# Patient Record
Sex: Female | Born: 1950
Health system: Southern US, Community
[De-identification: ages and names within clinical notes are randomized; demographics above are authoritative.]

## PROBLEM LIST (undated history)

## (undated) DIAGNOSIS — J449 Chronic obstructive pulmonary disease, unspecified: Secondary | ICD-10-CM

## (undated) DIAGNOSIS — E785 Hyperlipidemia, unspecified: Secondary | ICD-10-CM

## (undated) DIAGNOSIS — F329 Major depressive disorder, single episode, unspecified: Secondary | ICD-10-CM

## (undated) DIAGNOSIS — I10 Essential (primary) hypertension: Secondary | ICD-10-CM

## (undated) DIAGNOSIS — F419 Anxiety disorder, unspecified: Secondary | ICD-10-CM

## (undated) DIAGNOSIS — M1990 Unspecified osteoarthritis, unspecified site: Secondary | ICD-10-CM

## (undated) DIAGNOSIS — Z9889 Other specified postprocedural states: Secondary | ICD-10-CM

## (undated) DIAGNOSIS — K219 Gastro-esophageal reflux disease without esophagitis: Secondary | ICD-10-CM

## (undated) DIAGNOSIS — R928 Other abnormal and inconclusive findings on diagnostic imaging of breast: Secondary | ICD-10-CM

## (undated) DIAGNOSIS — F32A Depression, unspecified: Secondary | ICD-10-CM

## (undated) DIAGNOSIS — M8580 Other specified disorders of bone density and structure, unspecified site: Secondary | ICD-10-CM

## (undated) DIAGNOSIS — R112 Nausea with vomiting, unspecified: Secondary | ICD-10-CM

## (undated) HISTORY — DX: Other specified disorders of bone density and structure, unspecified site: M85.80

## (undated) HISTORY — DX: Hyperlipidemia, unspecified: E78.5

## (undated) HISTORY — DX: Unspecified osteoarthritis, unspecified site: M19.90

## (undated) HISTORY — PX: OTHER SURGICAL HISTORY: SHX169

## (undated) HISTORY — DX: Anxiety disorder, unspecified: F41.9

## (undated) HISTORY — DX: Essential (primary) hypertension: I10

---

## 1982-01-29 HISTORY — PX: TUBAL LIGATION: SHX77

## 1998-01-29 HISTORY — PX: GALLBLADDER SURGERY: SHX652

## 2007-08-04 ENCOUNTER — Ambulatory Visit: Payer: Self-pay | Admitting: Orthopedic Surgery

## 2007-08-04 DIAGNOSIS — M25469 Effusion, unspecified knee: Secondary | ICD-10-CM

## 2007-08-04 DIAGNOSIS — M1710 Unilateral primary osteoarthritis, unspecified knee: Secondary | ICD-10-CM

## 2007-08-04 DIAGNOSIS — M25569 Pain in unspecified knee: Secondary | ICD-10-CM

## 2007-08-20 ENCOUNTER — Ambulatory Visit: Payer: Self-pay | Admitting: Orthopedic Surgery

## 2007-08-20 DIAGNOSIS — M23302 Other meniscus derangements, unspecified lateral meniscus, unspecified knee: Secondary | ICD-10-CM | POA: Insufficient documentation

## 2007-08-28 ENCOUNTER — Telehealth: Payer: Self-pay | Admitting: Orthopedic Surgery

## 2007-08-28 ENCOUNTER — Ambulatory Visit (HOSPITAL_COMMUNITY): Admission: RE | Admit: 2007-08-28 | Discharge: 2007-08-28 | Payer: Self-pay | Admitting: Orthopedic Surgery

## 2007-09-08 ENCOUNTER — Ambulatory Visit: Payer: Self-pay | Admitting: Orthopedic Surgery

## 2007-10-08 ENCOUNTER — Ambulatory Visit: Payer: Self-pay | Admitting: Orthopedic Surgery

## 2007-10-24 ENCOUNTER — Encounter: Payer: Self-pay | Admitting: Orthopedic Surgery

## 2008-05-04 ENCOUNTER — Encounter: Payer: Self-pay | Admitting: Orthopedic Surgery

## 2008-05-25 ENCOUNTER — Ambulatory Visit: Payer: Self-pay | Admitting: Orthopedic Surgery

## 2008-05-25 DIAGNOSIS — M7580 Other shoulder lesions, unspecified shoulder: Secondary | ICD-10-CM

## 2008-05-25 DIAGNOSIS — M25519 Pain in unspecified shoulder: Secondary | ICD-10-CM

## 2008-07-07 ENCOUNTER — Ambulatory Visit: Payer: Self-pay | Admitting: Orthopedic Surgery

## 2015-08-30 ENCOUNTER — Encounter: Payer: Self-pay | Admitting: *Deleted

## 2015-08-31 ENCOUNTER — Ambulatory Visit (INDEPENDENT_AMBULATORY_CARE_PROVIDER_SITE_OTHER): Payer: Medicare HMO | Admitting: Cardiology

## 2015-08-31 ENCOUNTER — Encounter: Payer: Self-pay | Admitting: Cardiology

## 2015-08-31 ENCOUNTER — Encounter: Payer: Self-pay | Admitting: *Deleted

## 2015-08-31 VITALS — BP 147/72 | HR 55 | Ht 63.0 in | Wt 213.8 lb

## 2015-08-31 DIAGNOSIS — I493 Ventricular premature depolarization: Secondary | ICD-10-CM

## 2015-08-31 DIAGNOSIS — I10 Essential (primary) hypertension: Secondary | ICD-10-CM

## 2015-08-31 DIAGNOSIS — R079 Chest pain, unspecified: Secondary | ICD-10-CM

## 2015-08-31 NOTE — Progress Notes (Signed)
Clinical Summary Kristin Peters is a 65 y.o.female seen today as a new patient. She is referred by Dr Woody Seller.   1. Chest pain:  - admission to [facility] 07/2015 with chest pain. Negative workup for ACS.  - 07/2015 echo [facility]: LVEF 55-60%, mild LVH, abnormal diastolic function,   - started about 1 year ago. Dull pain under left breast, 2-3/10. Can occur at any time. Can have some dizziness. No SOB. Not positional. Lasts a few minutes. Occurs 1-2 times a month. No relation to food - has noticed some DOE with housework.   CAD risk factors: HTN, hyperlipidemia, father with "heart troubles", son with history of bicuspid   2. HTN - lisionpril caused cough, changed to losartan - compliant with meds  3. PVCs - noted on previous EKGs.  - she denies any specific palpitations   Past Medical History:  Diagnosis Date  . Anxiety   . Arthritis   . Dyslipidemia   . Hypertension   . Osteopenia      Allergies  Allergen Reactions  . Codeine     REACTION: itch     Current Outpatient Prescriptions  Medication Sig Dispense Refill  . budesonide-formoterol (SYMBICORT) 160-4.5 MCG/ACT inhaler Inhale 2 puffs into the lungs 2 (two) times daily.    . citalopram (CELEXA) 20 MG tablet Take 20 mg by mouth daily.    Marland Kitchen lisinopril (PRINIVIL,ZESTRIL) 20 MG tablet Take 20 mg by mouth daily.    . metoprolol (LOPRESSOR) 50 MG tablet Take 50 mg by mouth 2 (two) times daily.    Marland Kitchen omeprazole (PRILOSEC) 20 MG capsule Take 20 mg by mouth daily.    . simvastatin (ZOCOR) 20 MG tablet Take 20 mg by mouth daily.     No current facility-administered medications for this visit.      No past surgical history on file.   Allergies  Allergen Reactions  . Codeine     REACTION: itch      Family History  Problem Relation Age of Onset  . COPD Mother      Social History Kristin Peters reports that she has never smoked. She has never used smokeless tobacco. Kristin Peters has no alcohol history  on file.   Review of Systems CONSTITUTIONAL: No weight loss, fever, chills, weakness or fatigue.  HEENT: Eyes: No visual loss, blurred vision, double vision or yellow sclerae.No hearing loss, sneezing, congestion, runny nose or sore throat.  SKIN: No rash or itching.  CARDIOVASCULAR: per HPI RESPIRATORY: per HPI GASTROINTESTINAL: No anorexia, nausea, vomiting or diarrhea. No abdominal pain or blood.  GENITOURINARY: No burning on urination, no polyuria NEUROLOGICAL: No headache, dizziness, syncope, paralysis, ataxia, numbness or tingling in the extremities. No change in bowel or bladder control.  MUSCULOSKELETAL: No muscle, back pain, joint pain or stiffness.  LYMPHATICS: No enlarged nodes. No history of splenectomy.  PSYCHIATRIC: No history of depression or anxiety.  ENDOCRINOLOGIC: No reports of sweating, cold or heat intolerance. No polyuria or polydipsia.  Marland Kitchen   Physical Examination Vitals:   08/31/15 0840  BP: (!) 147/72  Pulse: (!) 55   Vitals:   08/31/15 0840  Weight: 213 lb 12.8 oz (97 kg)  Height: 5\' 3"  (1.6 m)    Gen: resting comfortably, no acute distress HEENT: no scleral icterus, pupils equal round and reactive, no palptable cervical adenopathy,  CV: RRR, no m/r/g, no jvd Resp: Clear to auscultation bilaterally GI: abdomen is soft, non-tender, non-distended, normal bowel sounds, no hepatosplenomegaly  MSK: extremities are warm, no edema.  Skin: warm, no rash Neuro:  no focal deficits Psych: appropriate affect     Assessment and Plan  1. Chest pain - we will obtain a 2 day protocol lexiscan, she reports she is not able to run on treadmill  2. PVCs - frequent PVCs noted on EKG, we will obtain 24 hr holter to quantify and evaluate for any sustained arrhythmias. Stress test above to evaluate for ischemic heart disease  3. HTN - mildly elevated in clinic, monitor at this time, if elevated at f/u will titrate up meds.    F/u 3-4 weeks   Arnoldo Lenis,  M.D.

## 2015-08-31 NOTE — Patient Instructions (Signed)
Your physician recommends that you schedule a follow-up appointment in: 3-4 WEEKS WITH DR. Carlock  Your physician recommends that you continue on your current medications as directed. Please refer to the Current Medication list given to you today.  Your physician has requested that you have a lexiscan myoview. For further information please visit [email or web address]. Please follow instruction sheet, as given.  Your physician has recommended that you wear a holter monitor FOR 24 HOURS. Holter monitors are medical devices that record the heart's electrical activity. Doctors most often use these monitors to diagnose arrhythmias. Arrhythmias are problems with the speed or rhythm of the heartbeat. The monitor is a small, portable device. You can wear one while you do your normal daily activities. This is usually used to diagnose what is causing palpitations/syncope (passing out).  Thank you for choosing [facility]!!

## 2015-09-06 ENCOUNTER — Encounter (HOSPITAL_COMMUNITY): Payer: Self-pay

## 2015-09-06 ENCOUNTER — Encounter (HOSPITAL_COMMUNITY)
Admission: RE | Admit: 2015-09-06 | Discharge: 2015-09-06 | Disposition: A | Discharge status: Home / Self Care | Payer: Medicare HMO | Source: Ambulatory Visit | Attending: Cardiology | Admitting: Cardiology

## 2015-09-06 ENCOUNTER — Inpatient Hospital Stay (HOSPITAL_COMMUNITY): Admission: RE | Admit: 2015-09-06 | Payer: Medicare HMO | Source: Ambulatory Visit

## 2015-09-06 DIAGNOSIS — R079 Chest pain, unspecified: Secondary | ICD-10-CM

## 2015-09-06 MED ORDER — SODIUM CHLORIDE 0.9% FLUSH
INTRAVENOUS | Status: AC
  Filled 2015-09-06: qty 120

## 2015-09-06 MED ORDER — SODIUM CHLORIDE 0.9% FLUSH
INTRAVENOUS | Status: AC
  Administered 2015-09-06: 10 mL via INTRAVENOUS
  Filled 2015-09-06: qty 10

## 2015-09-06 MED ORDER — REGADENOSON 0.4 MG/5ML IV SOLN
INTRAVENOUS | Status: AC
  Administered 2015-09-06: 0.4 mg via INTRAVENOUS
  Filled 2015-09-06: qty 5

## 2015-09-06 MED ORDER — TECHNETIUM TC 99M TETROFOSMIN IV KIT
30.0000 | PACK | Freq: Once | INTRAVENOUS | Status: AC | PRN
  Administered 2015-09-06: 30 via INTRAVENOUS

## 2015-09-07 ENCOUNTER — Encounter (HOSPITAL_COMMUNITY)
Admission: RE | Admit: 2015-09-07 | Discharge: 2015-09-07 | Disposition: A | Discharge status: Home / Self Care | Payer: Medicare HMO | Source: Ambulatory Visit | Attending: Cardiology | Admitting: Cardiology

## 2015-09-07 ENCOUNTER — Ambulatory Visit (INDEPENDENT_AMBULATORY_CARE_PROVIDER_SITE_OTHER): Payer: Medicare HMO

## 2015-09-07 ENCOUNTER — Other Ambulatory Visit: Payer: Self-pay | Admitting: *Deleted

## 2015-09-07 DIAGNOSIS — R079 Chest pain, unspecified: Secondary | ICD-10-CM

## 2015-09-07 LAB — NM MYOCAR MULTI W/SPECT W/WALL MOTION / EF
CHL CUP NUCLEAR SSS: 5
LV sys vol: 24 mL
LVDIAVOL: 65 mL (ref 46–106)
NUC STRESS TID: 1.12
Peak HR: 91 {beats}/min
RATE: 0.32
Rest HR: 53 {beats}/min
SDS: 2
SRS: 3

## 2015-09-07 MED ORDER — TECHNETIUM TC 99M TETROFOSMIN IV KIT
25.0000 | PACK | Freq: Once | INTRAVENOUS | Status: AC | PRN
  Administered 2015-09-07: 26 via INTRAVENOUS

## 2015-09-08 ENCOUNTER — Telehealth: Payer: Self-pay | Admitting: *Deleted

## 2015-09-08 NOTE — Telephone Encounter (Signed)
-----   Message from Arnoldo Lenis, MD sent at 09/08/2015  1:48 PM EDT ----- Heart monitor shows some occasional PVCs, overall the number is in the mild to moderate range. Will discuss further at our f/u, overall just something to monitor at this time  Zandra Abts MD

## 2015-09-08 NOTE — Telephone Encounter (Signed)
-----   Message from Arnoldo Lenis, MD sent at 09/08/2015  1:48 PM EDT ----- Stress test overall looks good, no significant evidence of blockages. Will discuss at our f/u  Zandra Abts MD

## 2015-09-08 NOTE — Telephone Encounter (Signed)
Pt aware, routed to pcp. Confirmed 9/7 appt

## 2015-10-06 ENCOUNTER — Encounter: Payer: Self-pay | Admitting: Cardiology

## 2015-10-06 ENCOUNTER — Ambulatory Visit (INDEPENDENT_AMBULATORY_CARE_PROVIDER_SITE_OTHER): Payer: Medicare HMO | Admitting: Cardiology

## 2015-10-06 VITALS — BP 130/80 | HR 62 | Ht 63.0 in | Wt 220.4 lb

## 2015-10-06 DIAGNOSIS — I493 Ventricular premature depolarization: Secondary | ICD-10-CM

## 2015-10-06 DIAGNOSIS — R079 Chest pain, unspecified: Secondary | ICD-10-CM

## 2015-10-06 DIAGNOSIS — I10 Essential (primary) hypertension: Secondary | ICD-10-CM | POA: Diagnosis not present

## 2015-10-06 MED ORDER — METOPROLOL TARTRATE 25 MG PO TABS: 25.0000 mg | ORAL_TABLET | Freq: Two times a day (BID) | ORAL | 3 refills | Status: DC

## 2015-10-06 NOTE — Progress Notes (Signed)
Clinical Summary Ms. [place] is a 65 y.o.female seen today for follow up of the following medical problems.    1. Chest pain:  - admission to [facility] 07/2015 with chest pain. Negative workup for ACS.  - 07/2015 echo [facility]: LVEF 55-60%, mild LVH, abnormal diastolic function,   - symptoms started about 1 year ago. Dull pain under left breast, 2-3/10. Can occur at any time. Can have some dizziness. No SOB. Not positional. Lasts a few minutes. Occurs 1-2 times a month. No relation to food - has noticed some DOE with housework.  CAD risk factors: HTN, hyperlipidemia, father with "heart troubles", son with history of bicuspid  - recent lexiscan overall low risk, no significant ischemia.   2. HTN - lisionpril caused cough, changed to losartan - compliant with meds since last visit.   3. PVCs - noted on previous EKGs.  - she denies any specific palpitations - holter with 9% PVCs - no evidence of structural heart disease by echo or nuclear stress test.     Past Medical History:  Diagnosis Date  . Anxiety   . Arthritis   . Dyslipidemia   . Hypertension   . Osteopenia      Allergies  Allergen Reactions  . Codeine     REACTION: itch  . Lisinopril Cough     Current Outpatient Prescriptions  Medication Sig Dispense Refill  . ALPRAZolam (XANAX) 0.5 MG tablet Take 0.5 mg by mouth 2 (two) times daily.    Marland Kitchen aspirin 81 MG tablet Take 81 mg by mouth daily.    . budesonide-formoterol (SYMBICORT) 160-4.5 MCG/ACT inhaler Inhale 2 puffs into the lungs as needed.     . cetirizine (ZYRTEC) 10 MG tablet Take 10 mg by mouth as needed for allergies.    . citalopram (CELEXA) 20 MG tablet Take 20 mg by mouth daily.    Marland Kitchen losartan (COZAAR) 100 MG tablet Take 100 mg by mouth daily.    . metaxalone (SKELAXIN) 800 MG tablet Take 800 mg by mouth as needed for muscle spasms.    . metoprolol (LOPRESSOR) 50 MG tablet Take 50 mg by mouth 2 (two) times daily.    Marland Kitchen  omeprazole (PRILOSEC) 20 MG capsule Take 20 mg by mouth 2 (two) times daily.     . simvastatin (ZOCOR) 20 MG tablet Take 20 mg by mouth daily.     No current facility-administered medications for this visit.      Past Surgical History:  Procedure Laterality Date  . GALLBLADDER SURGERY  2000  . left toe surgery x 3     hammer toe 01-14-08 & 01-06-10, remove screw 09-08-12, broke both sides of that ankle 08-14-14  . TUBAL LIGATION  1984     Allergies  Allergen Reactions  . Codeine     REACTION: itch  . Lisinopril Cough      Family History  Problem Relation Age of Onset  . COPD Mother      Social History Ms. [place] reports that she has never smoked. She has never used smokeless tobacco. Ms. [place] has no alcohol history on file.   Review of Systems CONSTITUTIONAL: No weight loss, fever, chills, weakness or fatigue.  HEENT: Eyes: No visual loss, blurred vision, double vision or yellow sclerae.No hearing loss, sneezing, congestion, runny nose or sore throat.  SKIN: No rash or itching.  CARDIOVASCULAR: per HPI RESPIRATORY: No shortness of breath, cough or sputum.  GASTROINTESTINAL: No anorexia, nausea, vomiting or  diarrhea. No abdominal pain or blood.  GENITOURINARY: No burning on urination, no polyuria NEUROLOGICAL: No headache, dizziness, syncope, paralysis, ataxia, numbness or tingling in the extremities. No change in bowel or bladder control.  MUSCULOSKELETAL: No muscle, back pain, joint pain or stiffness.  LYMPHATICS: No enlarged nodes. No history of splenectomy.  PSYCHIATRIC: No history of depression or anxiety.  ENDOCRINOLOGIC: No reports of sweating, cold or heat intolerance. No polyuria or polydipsia.  Marland Kitchen   Physical Examination Vitals:   10/06/15 1140  BP: 130/80  Pulse: 62   Vitals:   10/06/15 1140  Weight: 220 lb 6.4 oz (100 kg)  Height: 5\' 3"  (1.6 m)    Gen: resting comfortably, no acute distress HEENT: no scleral icterus, pupils equal round and  reactive, no palptable cervical adenopathy,  CV: RRR, no m/r/g, no jvd Resp: Clear to auscultation bilaterally GI: abdomen is soft, non-tender, non-distended, normal bowel sounds, no hepatosplenomegaly MSK: extremities are warm, no edema.  Skin: warm, no rash Neuro:  no focal deficits Psych: appropriate affect   Diagnostic Studies 08/2015 Holter  Rhythm throughout study is sinus rhythm  Min HR 46, max HR 92, Avg HR 62. The low heart rate of 46 occurred at 3AM presumably while patient sleeping  There is rare supraventicular ectopy  There is occasional ventricular ectopy (7393 beats, 9%) in the form of isolated PVCs and bigeminy. No nonsustained or sustained ventricuarl arrhythmias  No diary submitted   08/2015 Nuclear stress  ST segment depression was noted during stress in the II, III, aVF, V5 and V6 leads. Nonspecific TW change in V3-V4.  The study is normal. Small mild basal septal defect likely related to adjacent gut radiotracer uptake, small area of ischemia less likely. Either findings supports low risk.  The left ventricular ejection fraction is normal (55-65%).  This is a low risk study.      Assessment and Plan  1. Chest pain - symptoms improved since last visit. Recent lexiscan overall low risk - continue to monitor at this time - EKG shows NSR, no ischemic changes  2. PVCs - mild to moderate PVC burden by recent monitor. Asymptomatic. No significant structural heart disase by nuclear stress or echo - change lopressor to 25mg  bid  3. HTN - at goal, continue current meds   F/u 6 months      Arnoldo Lenis, M.D.

## 2015-10-06 NOTE — Patient Instructions (Signed)
Your physician wants you to follow-up in: [place] DR. BRANCH  You will receive a reminder letter in the mail two months in advance. If you don't receive a letter, please call our office to schedule the follow-up appointment.  Your physician has recommended you make the following change in your medication:   DECREASE METOPROLOL 25 MG TWICE DAILY  Thank you for choosing [facility]!!

## 2016-02-02 DIAGNOSIS — R69 Illness, unspecified: Secondary | ICD-10-CM | POA: Diagnosis not present

## 2016-02-02 DIAGNOSIS — Z789 Other specified health status: Secondary | ICD-10-CM | POA: Diagnosis not present

## 2016-02-02 DIAGNOSIS — B356 Tinea cruris: Secondary | ICD-10-CM | POA: Diagnosis not present

## 2016-02-02 DIAGNOSIS — Z299 Encounter for prophylactic measures, unspecified: Secondary | ICD-10-CM | POA: Diagnosis not present

## 2016-02-02 DIAGNOSIS — Z6838 Body mass index (BMI) 38.0-38.9, adult: Secondary | ICD-10-CM | POA: Diagnosis not present

## 2016-02-02 DIAGNOSIS — I10 Essential (primary) hypertension: Secondary | ICD-10-CM | POA: Diagnosis not present

## 2016-04-17 ENCOUNTER — Ambulatory Visit: Payer: Medicare HMO | Admitting: Cardiology

## 2016-04-26 DIAGNOSIS — M79672 Pain in left foot: Secondary | ICD-10-CM | POA: Diagnosis not present

## 2016-04-26 DIAGNOSIS — M79671 Pain in right foot: Secondary | ICD-10-CM | POA: Diagnosis not present

## 2016-04-26 DIAGNOSIS — T8484XA Pain due to internal orthopedic prosthetic devices, implants and grafts, initial encounter: Secondary | ICD-10-CM | POA: Diagnosis not present

## 2016-04-26 DIAGNOSIS — M722 Plantar fascial fibromatosis: Secondary | ICD-10-CM | POA: Diagnosis not present

## 2016-04-30 ENCOUNTER — Encounter: Payer: Self-pay | Admitting: Cardiology

## 2016-04-30 ENCOUNTER — Ambulatory Visit (INDEPENDENT_AMBULATORY_CARE_PROVIDER_SITE_OTHER): Payer: Medicare HMO | Admitting: Cardiology

## 2016-04-30 VITALS — BP 112/75 | HR 62 | Ht 63.0 in | Wt 224.6 lb

## 2016-04-30 DIAGNOSIS — R0789 Other chest pain: Secondary | ICD-10-CM

## 2016-04-30 DIAGNOSIS — I10 Essential (primary) hypertension: Secondary | ICD-10-CM

## 2016-04-30 DIAGNOSIS — I493 Ventricular premature depolarization: Secondary | ICD-10-CM

## 2016-04-30 DIAGNOSIS — M79606 Pain in leg, unspecified: Secondary | ICD-10-CM

## 2016-04-30 NOTE — Patient Instructions (Signed)
Medication Instructions:  Continue all current medications.  Labwork: none  Testing/Procedures:  Your physician has requested that you have an ankle brachial index (ABI). During this test an ultrasound and blood pressure cuff are used to evaluate the arteries that supply the arms and legs with blood. Allow thirty minutes for this exam. There are no restrictions or special instructions.  Office will contact with results via phone or letter.    Follow-Up: Your physician wants you to follow up in: 6 months.  You will receive a reminder letter in the mail one-two months in advance.  If you don't receive a letter, please call our office to schedule the follow up appointment   Any Other Special Instructions Will Be Listed Below (If Applicable).  If you need a refill on your cardiac medications before your next appointment, please call your pharmacy.

## 2016-04-30 NOTE — Progress Notes (Signed)
Clinical Summary Kristin Peters is a 66 y.o.female seen today for follow up of the following medical problems.    1. Chest pain - admission to [facility] 07/2015 with chest pain. Negative workup for ACS.  - 07/2015 echo [facility]: LVEF 55-60%, mild LVH, abnormal diastolic function,   - 0/9811 lexiscan overall low risk, no significant ischemia.  - no recent chest pain   2. HTN - lisionpril caused cough, changed to losartan - she remains compliant with meds  3. PVCs - noted on previous EKGs.  - holter with 9% PVCs - no evidence of structural heart disease by echo or nuclear stress test.   - no recent palpitations since last visit  4. Leg pain -can get right sided leg cramps. Mainly occurs at night.   5. LE edema - left sided, prior fracture in the past on left.    Upcoming labs with pcp  Past Medical History:  Diagnosis Date  . Anxiety   . Arthritis   . Dyslipidemia   . Hypertension   . Osteopenia      Allergies  Allergen Reactions  . Codeine     REACTION: itch  . Lisinopril Cough     Current Outpatient Prescriptions  Medication Sig Dispense Refill  . ALPRAZolam (XANAX) 0.5 MG tablet Take 0.5 mg by mouth 2 (two) times daily.    Marland Kitchen amLODipine (NORVASC) 5 MG tablet Take 5 mg by mouth daily.    Marland Kitchen aspirin 81 MG tablet Take 81 mg by mouth daily.    . budesonide-formoterol (SYMBICORT) 160-4.5 MCG/ACT inhaler Inhale 2 puffs into the lungs as needed.     . cetirizine (ZYRTEC) 10 MG tablet Take 10 mg by mouth as needed for allergies.    . citalopram (CELEXA) 20 MG tablet Take 20 mg by mouth daily.    Marland Kitchen losartan (COZAAR) 100 MG tablet Take 100 mg by mouth daily.    . metaxalone (SKELAXIN) 800 MG tablet Take 800 mg by mouth as needed for muscle spasms.    . metoprolol tartrate (LOPRESSOR) 25 MG tablet Take 1 tablet (25 mg total) by mouth 2 (two) times daily. 180 tablet 3  . omeprazole (PRILOSEC) 20 MG capsule Take 20 mg by mouth 2 (two) times daily.      . simvastatin (ZOCOR) 20 MG tablet Take 20 mg by mouth daily.     No current facility-administered medications for this visit.      Past Surgical History:  Procedure Laterality Date  . GALLBLADDER SURGERY  2000  . left toe surgery x 3     hammer toe 01-14-08 & 01-06-10, remove screw 09-08-12, broke both sides of that ankle 08-14-14  . TUBAL LIGATION  1984     Allergies  Allergen Reactions  . Codeine     REACTION: itch  . Lisinopril Cough      Family History  Problem Relation Age of Onset  . COPD Mother      Social History Kristin Peters reports that she has never smoked. She has never used smokeless tobacco. Kristin Peters has no alcohol history on file.   Review of Systems CONSTITUTIONAL: No weight loss, fever, chills, weakness or fatigue.  HEENT: Eyes: No visual loss, blurred vision, double vision or yellow sclerae.No hearing loss, sneezing, congestion, runny nose or sore throat.  SKIN: No rash or itching.  CARDIOVASCULAR: per hpi RESPIRATORY: No shortness of breath, cough or sputum.  GASTROINTESTINAL: No anorexia, nausea, vomiting or diarrhea. No abdominal  pain or blood.  GENITOURINARY: No burning on urination, no polyuria NEUROLOGICAL: No headache, dizziness, syncope, paralysis, ataxia, numbness or tingling in the extremities. No change in bowel or bladder control.  MUSCULOSKELETAL: No muscle, back pain, joint pain or stiffness.  LYMPHATICS: No enlarged nodes. No history of splenectomy.  PSYCHIATRIC: No history of depression or anxiety.  ENDOCRINOLOGIC: No reports of sweating, cold or heat intolerance. No polyuria or polydipsia.  Marland Kitchen   Physical Examination Vitals:   04/30/16 1527  BP: 112/75  Pulse: 62   Vitals:   04/30/16 1527  Weight: 224 lb 9.6 oz (101.9 kg)  Height: 5\' 3"  (1.6 m)    Gen: resting comfortably, no acute distress HEENT: no scleral icterus, pupils equal round and reactive, no palptable cervical adenopathy,  CV: RRR, no m/r/g, no jvd.  Decreased bilateral PT/DP pulsesl Resp: Clear to auscultation bilaterally GI: abdomen is soft, non-tender, non-distended, normal bowel sounds, no hepatosplenomegaly MSK: extremities are warm, no edema.  Skin: warm, no rash Neuro:  no focal deficits Psych: appropriate affect   Diagnostic Studies 08/2015 Holter  Rhythm throughout study is sinus rhythm  Min HR 46, max HR 92, Avg HR 62. The low heart rate of 46 occurred at 3AM presumably while patient sleeping  There is rare supraventicular ectopy  There is occasional ventricular ectopy (7393 beats, 9%) in the form of isolated PVCs and bigeminy. No nonsustained or sustained ventricuarl arrhythmias  No diary submitted   08/2015 Nuclear stress  ST segment depression was noted during stress in the II, III, aVF, V5 and V6 leads. Nonspecific TW change in V3-V4.  The study is normal. Small mild basal septal defect likely related to adjacent gut radiotracer uptake, small area of ischemia less likely. Either findings supports low risk.  The left ventricular ejection fraction is normal (55-65%).  This is a low risk study.     Assessment and Plan  1. Chest pain - Recent lexiscan overall low risk - no recent symptoms - continue to monitor.   2. PVCs - mild to moderate PVC burden by recent monitor. Asymptomatic. No significant structural heart disase by nuclear stress or echo - continue beta blocker  3. HTN - bp is at goal, continue current meds  4. Leg pains - atypical for claudication, she does have dimished pulses bilaterally obtian abi's   F/u 6 months      Arnoldo Lenis, M.D.

## 2016-05-02 ENCOUNTER — Other Ambulatory Visit: Payer: Self-pay | Admitting: Cardiology

## 2016-05-02 DIAGNOSIS — Z0000 Encounter for general adult medical examination without abnormal findings: Secondary | ICD-10-CM | POA: Diagnosis not present

## 2016-05-02 DIAGNOSIS — Z713 Dietary counseling and surveillance: Secondary | ICD-10-CM | POA: Diagnosis not present

## 2016-05-02 DIAGNOSIS — E7800 Pure hypercholesterolemia, unspecified: Secondary | ICD-10-CM | POA: Diagnosis not present

## 2016-05-02 DIAGNOSIS — I10 Essential (primary) hypertension: Secondary | ICD-10-CM | POA: Diagnosis not present

## 2016-05-02 DIAGNOSIS — Z1211 Encounter for screening for malignant neoplasm of colon: Secondary | ICD-10-CM | POA: Diagnosis not present

## 2016-05-02 DIAGNOSIS — I739 Peripheral vascular disease, unspecified: Secondary | ICD-10-CM

## 2016-05-02 DIAGNOSIS — R69 Illness, unspecified: Secondary | ICD-10-CM | POA: Diagnosis not present

## 2016-05-02 DIAGNOSIS — Z79899 Other long term (current) drug therapy: Secondary | ICD-10-CM | POA: Diagnosis not present

## 2016-05-02 DIAGNOSIS — Z7189 Other specified counseling: Secondary | ICD-10-CM | POA: Diagnosis not present

## 2016-05-02 DIAGNOSIS — Z1389 Encounter for screening for other disorder: Secondary | ICD-10-CM | POA: Diagnosis not present

## 2016-05-02 DIAGNOSIS — Z299 Encounter for prophylactic measures, unspecified: Secondary | ICD-10-CM | POA: Diagnosis not present

## 2016-05-02 DIAGNOSIS — K589 Irritable bowel syndrome without diarrhea: Secondary | ICD-10-CM | POA: Diagnosis not present

## 2016-05-09 ENCOUNTER — Ambulatory Visit: Payer: Medicare HMO

## 2016-05-09 DIAGNOSIS — I739 Peripheral vascular disease, unspecified: Secondary | ICD-10-CM

## 2016-05-09 DIAGNOSIS — R0989 Other specified symptoms and signs involving the circulatory and respiratory systems: Secondary | ICD-10-CM | POA: Diagnosis not present

## 2016-05-09 DIAGNOSIS — R202 Paresthesia of skin: Secondary | ICD-10-CM | POA: Diagnosis not present

## 2016-05-10 DIAGNOSIS — D0471 Carcinoma in situ of skin of right lower limb, including hip: Secondary | ICD-10-CM | POA: Diagnosis not present

## 2016-05-10 DIAGNOSIS — Z299 Encounter for prophylactic measures, unspecified: Secondary | ICD-10-CM | POA: Diagnosis not present

## 2016-05-10 DIAGNOSIS — Z6839 Body mass index (BMI) 39.0-39.9, adult: Secondary | ICD-10-CM | POA: Diagnosis not present

## 2016-05-10 DIAGNOSIS — L820 Inflamed seborrheic keratosis: Secondary | ICD-10-CM | POA: Diagnosis not present

## 2016-05-14 DIAGNOSIS — M1711 Unilateral primary osteoarthritis, right knee: Secondary | ICD-10-CM | POA: Diagnosis not present

## 2016-05-21 ENCOUNTER — Telehealth: Payer: Self-pay | Admitting: *Deleted

## 2016-05-21 NOTE — Telephone Encounter (Signed)
-----   Message from Arnoldo Lenis, MD sent at 05/21/2016 10:31 AM EDT ----- Leg tests show normal circulation, her leg pains are not related to any issues with circulation, needs to discuss other causes with pcp  J BrancH MD

## 2016-05-21 NOTE — Telephone Encounter (Signed)
Pt aware - routed to pcp  

## 2016-05-28 DIAGNOSIS — M1711 Unilateral primary osteoarthritis, right knee: Secondary | ICD-10-CM | POA: Diagnosis not present

## 2016-06-04 DIAGNOSIS — M1711 Unilateral primary osteoarthritis, right knee: Secondary | ICD-10-CM | POA: Diagnosis not present

## 2016-06-11 DIAGNOSIS — M1711 Unilateral primary osteoarthritis, right knee: Secondary | ICD-10-CM | POA: Diagnosis not present

## 2016-06-27 DIAGNOSIS — C4492 Squamous cell carcinoma of skin, unspecified: Secondary | ICD-10-CM | POA: Diagnosis not present

## 2016-06-27 DIAGNOSIS — B079 Viral wart, unspecified: Secondary | ICD-10-CM | POA: Diagnosis not present

## 2016-07-30 DIAGNOSIS — Z6838 Body mass index (BMI) 38.0-38.9, adult: Secondary | ICD-10-CM | POA: Diagnosis not present

## 2016-07-30 DIAGNOSIS — R69 Illness, unspecified: Secondary | ICD-10-CM | POA: Diagnosis not present

## 2016-07-30 DIAGNOSIS — R600 Localized edema: Secondary | ICD-10-CM | POA: Diagnosis not present

## 2016-07-30 DIAGNOSIS — E785 Hyperlipidemia, unspecified: Secondary | ICD-10-CM | POA: Diagnosis not present

## 2016-07-30 DIAGNOSIS — I10 Essential (primary) hypertension: Secondary | ICD-10-CM | POA: Diagnosis not present

## 2016-07-30 DIAGNOSIS — K589 Irritable bowel syndrome without diarrhea: Secondary | ICD-10-CM | POA: Diagnosis not present

## 2016-07-30 DIAGNOSIS — Z299 Encounter for prophylactic measures, unspecified: Secondary | ICD-10-CM | POA: Diagnosis not present

## 2016-07-30 DIAGNOSIS — Z713 Dietary counseling and surveillance: Secondary | ICD-10-CM | POA: Diagnosis not present

## 2016-07-30 DIAGNOSIS — M1710 Unilateral primary osteoarthritis, unspecified knee: Secondary | ICD-10-CM | POA: Diagnosis not present

## 2016-10-30 DIAGNOSIS — Z299 Encounter for prophylactic measures, unspecified: Secondary | ICD-10-CM | POA: Diagnosis not present

## 2016-10-30 DIAGNOSIS — C4492 Squamous cell carcinoma of skin, unspecified: Secondary | ICD-10-CM | POA: Diagnosis not present

## 2016-10-30 DIAGNOSIS — K589 Irritable bowel syndrome without diarrhea: Secondary | ICD-10-CM | POA: Diagnosis not present

## 2016-10-30 DIAGNOSIS — Z23 Encounter for immunization: Secondary | ICD-10-CM | POA: Diagnosis not present

## 2016-10-30 DIAGNOSIS — E785 Hyperlipidemia, unspecified: Secondary | ICD-10-CM | POA: Diagnosis not present

## 2016-10-30 DIAGNOSIS — R69 Illness, unspecified: Secondary | ICD-10-CM | POA: Diagnosis not present

## 2016-10-30 DIAGNOSIS — M1710 Unilateral primary osteoarthritis, unspecified knee: Secondary | ICD-10-CM | POA: Diagnosis not present

## 2016-10-30 DIAGNOSIS — L259 Unspecified contact dermatitis, unspecified cause: Secondary | ICD-10-CM | POA: Diagnosis not present

## 2016-10-30 DIAGNOSIS — I10 Essential (primary) hypertension: Secondary | ICD-10-CM | POA: Diagnosis not present

## 2016-10-30 DIAGNOSIS — Z6839 Body mass index (BMI) 39.0-39.9, adult: Secondary | ICD-10-CM | POA: Diagnosis not present

## 2017-01-29 HISTORY — PX: BREAST EXCISIONAL BIOPSY: SUR124

## 2017-01-29 HISTORY — PX: BREAST BIOPSY: SHX20

## 2017-01-30 DIAGNOSIS — I10 Essential (primary) hypertension: Secondary | ICD-10-CM | POA: Diagnosis not present

## 2017-01-30 DIAGNOSIS — Z6839 Body mass index (BMI) 39.0-39.9, adult: Secondary | ICD-10-CM | POA: Diagnosis not present

## 2017-01-30 DIAGNOSIS — R69 Illness, unspecified: Secondary | ICD-10-CM | POA: Diagnosis not present

## 2017-01-30 DIAGNOSIS — Z299 Encounter for prophylactic measures, unspecified: Secondary | ICD-10-CM | POA: Diagnosis not present

## 2017-01-30 DIAGNOSIS — E785 Hyperlipidemia, unspecified: Secondary | ICD-10-CM | POA: Diagnosis not present

## 2017-05-07 DIAGNOSIS — E7800 Pure hypercholesterolemia, unspecified: Secondary | ICD-10-CM | POA: Diagnosis not present

## 2017-05-07 DIAGNOSIS — I10 Essential (primary) hypertension: Secondary | ICD-10-CM | POA: Diagnosis not present

## 2017-05-07 DIAGNOSIS — M159 Polyosteoarthritis, unspecified: Secondary | ICD-10-CM | POA: Diagnosis not present

## 2017-05-08 DIAGNOSIS — B356 Tinea cruris: Secondary | ICD-10-CM | POA: Diagnosis not present

## 2017-05-08 DIAGNOSIS — E785 Hyperlipidemia, unspecified: Secondary | ICD-10-CM | POA: Diagnosis not present

## 2017-05-08 DIAGNOSIS — Z0000 Encounter for general adult medical examination without abnormal findings: Secondary | ICD-10-CM | POA: Diagnosis not present

## 2017-05-08 DIAGNOSIS — R5383 Other fatigue: Secondary | ICD-10-CM | POA: Diagnosis not present

## 2017-05-08 DIAGNOSIS — Z1211 Encounter for screening for malignant neoplasm of colon: Secondary | ICD-10-CM | POA: Diagnosis not present

## 2017-05-08 DIAGNOSIS — J069 Acute upper respiratory infection, unspecified: Secondary | ICD-10-CM | POA: Diagnosis not present

## 2017-05-08 DIAGNOSIS — Z79899 Other long term (current) drug therapy: Secondary | ICD-10-CM | POA: Diagnosis not present

## 2017-05-08 DIAGNOSIS — Z7189 Other specified counseling: Secondary | ICD-10-CM | POA: Diagnosis not present

## 2017-05-08 DIAGNOSIS — Z1339 Encounter for screening examination for other mental health and behavioral disorders: Secondary | ICD-10-CM | POA: Diagnosis not present

## 2017-05-08 DIAGNOSIS — Z1331 Encounter for screening for depression: Secondary | ICD-10-CM | POA: Diagnosis not present

## 2017-05-08 DIAGNOSIS — I10 Essential (primary) hypertension: Secondary | ICD-10-CM | POA: Diagnosis not present

## 2017-05-08 DIAGNOSIS — R69 Illness, unspecified: Secondary | ICD-10-CM | POA: Diagnosis not present

## 2017-05-28 DIAGNOSIS — E2839 Other primary ovarian failure: Secondary | ICD-10-CM | POA: Diagnosis not present

## 2017-07-08 DIAGNOSIS — M1711 Unilateral primary osteoarthritis, right knee: Secondary | ICD-10-CM | POA: Diagnosis not present

## 2017-07-10 ENCOUNTER — Encounter: Payer: Self-pay | Admitting: Cardiology

## 2017-07-10 ENCOUNTER — Ambulatory Visit: Payer: Medicare HMO | Admitting: Cardiology

## 2017-07-10 VITALS — BP 137/81 | HR 58 | Ht 63.0 in | Wt 224.4 lb

## 2017-07-10 DIAGNOSIS — R0789 Other chest pain: Secondary | ICD-10-CM

## 2017-07-10 DIAGNOSIS — I10 Essential (primary) hypertension: Secondary | ICD-10-CM | POA: Diagnosis not present

## 2017-07-10 DIAGNOSIS — I493 Ventricular premature depolarization: Secondary | ICD-10-CM | POA: Diagnosis not present

## 2017-07-10 NOTE — Progress Notes (Signed)
Clinical Summary Kristin Peters is a 67 y.o.female seen today for follow up of the following medical problems.    1. Chest pain - admission to [facility] 07/2015 with chest pain. Negative workup for ACS.  - 07/2015 echo [facility]: LVEF 55-60%, mild LVH, abnormal diastolic function,  - 03/252 lexiscan overall low risk, no significant ischemia.    - mild chest pain at times. Epigastric to under left breast. Drank 7up with some improvement. Lasted about 10 minutes.  - no exertional chest pain.    2. HTN - lisionpril caused cough, changed to losartan - home bps 120s/80s.  - compliant with meds  3. PVCs - noted on previous EKGs.  - holter with 9% PVCs - no evidence of structural heart disease by echo or nuclear stress test.   - no recent palpitations since last visit   4. LE edema - left sided, prior fracture in the past on left.       Past Medical History:  Diagnosis Date  . Anxiety   . Arthritis   . Dyslipidemia   . Hypertension   . Osteopenia      Allergies  Allergen Reactions  . Codeine     REACTION: itch  . Lisinopril Cough     Current Outpatient Medications  Medication Sig Dispense Refill  . ALPRAZolam (XANAX) 0.5 MG tablet Take 0.5 mg by mouth 2 (two) times daily.    Marland Kitchen amLODipine (NORVASC) 5 MG tablet Take 5 mg by mouth daily.    Marland Kitchen aspirin 81 MG tablet Take 81 mg by mouth daily.    . budesonide-formoterol (SYMBICORT) 160-4.5 MCG/ACT inhaler Inhale 2 puffs into the lungs as needed.     . citalopram (CELEXA) 20 MG tablet Take 20 mg by mouth daily.    Marland Kitchen losartan (COZAAR) 100 MG tablet Take 100 mg by mouth daily.    . metaxalone (SKELAXIN) 800 MG tablet Take 800 mg by mouth as needed for muscle spasms.    . metoprolol tartrate (LOPRESSOR) 25 MG tablet Take 1 tablet (25 mg total) by mouth 2 (two) times daily. 180 tablet 3  . omeprazole (PRILOSEC) 20 MG capsule Take 20 mg by mouth 2 (two) times daily.     . simvastatin (ZOCOR) 20 MG  tablet Take 20 mg by mouth daily.     No current facility-administered medications for this visit.         Allergies  Allergen Reactions  . Codeine     REACTION: itch  . Lisinopril Cough      Family History  Problem Relation Age of Onset  . COPD Mother      Social History Kristin Peters reports that she has never smoked. She has never used smokeless tobacco. Kristin Peters has no alcohol history on file.   Review of Systems CONSTITUTIONAL: No weight loss, fever, chills, weakness or fatigue.  HEENT: Eyes: No visual loss, blurred vision, double vision or yellow sclerae.No hearing loss, sneezing, congestion, runny nose or sore throat.  SKIN: No rash or itching.  CARDIOVASCULAR: per hpi RESPIRATORY: No shortness of breath, cough or sputum.  GASTROINTESTINAL: No anorexia, nausea, vomiting or diarrhea. No abdominal pain or blood.  GENITOURINARY: No burning on urination, no polyuria NEUROLOGICAL: No headache, dizziness, syncope, paralysis, ataxia, numbness or tingling in the extremities. No change in bowel or bladder control.  MUSCULOSKELETAL: No muscle, back pain, joint pain or stiffness.  LYMPHATICS: No enlarged nodes. No history of splenectomy.  PSYCHIATRIC: No history  of depression or anxiety.  ENDOCRINOLOGIC: No reports of sweating, cold or heat intolerance. No polyuria or polydipsia.  Marland Kitchen   Physical Examination Vitals:   07/10/17 1533  BP: 137/81  Pulse: (!) 58  SpO2: 96%   Vitals:   07/10/17 1533  Weight: 224 lb 6.4 oz (101.8 kg)  Height: 5\' 3"  (1.6 m)    Gen: resting comfortably, no acute distress HEENT: no scleral icterus, pupils equal round and reactive, no palptable cervical adenopathy,  CV: RRR, no m/r/g, no jvd Resp: Clear to auscultation bilaterally GI: abdomen is soft, non-tender, non-distended, normal bowel sounds, no hepatosplenomegaly MSK: extremities are warm, no edema.  Skin: warm, no rash Neuro:  no focal deficits Psych: appropriate  affect   Diagnostic Studies  08/2015 Holter  Rhythm throughout study is sinus rhythm  Min HR 46, max HR 92, Avg HR 62. The low heart rate of 46 occurred at 3AM presumably while patient sleeping  There is rare supraventicular ectopy  There is occasional ventricular ectopy (7393 beats, 9%) in the form of isolated PVCs and bigeminy. No nonsustained or sustained ventricuarl arrhythmias  No diary submitted   08/2015 Nuclear stress  ST segment depression was noted during stress in the II, III, aVF, V5 and V6 leads. Nonspecific TW change in V3-V4.  The study is normal. Small mild basal septal defect likely related to adjacent gut radiotracer uptake, small area of ischemia less likely. Either findings supports low risk.  The left ventricular ejection fraction is normal (55-65%).  This is a low risk study.  04/2016 ABIs R 1.17 Left 1.16    Assessment and Plan   1. Chest pain - prior lexiscan overall low risk -recent mild atypicla symptoms. EKG today SR, no acute ischemic changes - continue to monitor.   2. PVCs - mild to moderate PVC burden by recent monitor. Asymptomatic. No significant structural heart disase by nuclear stress or echo - continue to monitor at this time.   3. HTN - overall at goal, continue current meds. Given info on DASH diet    F/u 1 year       Arnoldo Lenis, M.D.

## 2017-07-10 NOTE — Patient Instructions (Addendum)
Medication Instructions:  Your physician recommends that you continue on your current medications as directed. Please refer to the Current Medication list given to you today.  Labwork: NONE  Testing/Procedures: NONE  Follow-Up: Your physician wants you to follow-up in: [place] DR. BRANCH.  DASH Eating Plan DASH stands for "Dietary Approaches to Stop Hypertension." The DASH eating plan is a healthy eating plan that has been shown to reduce high blood pressure (hypertension). It may also reduce your risk for type 2 diabetes, heart disease, and stroke. The DASH eating plan may also help with weight loss. What are tips for following this plan? General guidelines  Avoid eating more than 2,300 mg (milligrams) of salt (sodium) a day. If you have hypertension, you may need to reduce your sodium intake to 1,500 mg a day.  Limit alcohol intake to no more than 1 drink a day for nonpregnant women and 2 drinks a day for men. One drink equals 12 oz of beer, 5 oz of wine, or 1 oz of hard liquor.  Work with your health care provider to maintain a healthy body weight or to lose weight. Ask what an ideal weight is for you.  Get at least 30 minutes of exercise that causes your heart to beat faster (aerobic exercise) most days of the week. Activities may include walking, swimming, or biking.  Work with your health care provider or diet and nutrition specialist (dietitian) to adjust your eating plan to your individual calorie needs. Reading food labels  Check food labels for the amount of sodium per serving. Choose foods with less than 5 percent of the Daily Value of sodium. Generally, foods with less than 300 mg of sodium per serving fit into this eating plan.  To find whole grains, look for the word "whole" as the first word in the ingredient list. Shopping  Buy products labeled as "low-sodium" or "no salt added."  Buy fresh foods. Avoid canned foods and premade or frozen  meals. Cooking  Avoid adding salt when cooking. Use salt-free seasonings or herbs instead of table salt or sea salt. Check with your health care provider or pharmacist before using salt substitutes.  Do not fry foods. Cook foods using healthy methods such as baking, boiling, grilling, and broiling instead.  Cook with heart-healthy oils, such as olive, canola, soybean, or sunflower oil. Meal planning   Eat a balanced diet that includes: ? 5 or more servings of fruits and vegetables each day. At each meal, try to fill half of your plate with fruits and vegetables. ? Up to 6-8 servings of whole grains each day. ? Less than 6 oz of lean meat, poultry, or fish each day. A 3-oz serving of meat is about the same size as a deck of cards. One egg equals 1 oz. ? 2 servings of low-fat dairy each day. ? A serving of nuts, seeds, or beans 5 times each week. ? Heart-healthy fats. Healthy fats called Omega-3 fatty acids are found in foods such as flaxseeds and coldwater fish, like sardines, salmon, and mackerel.  Limit how much you eat of the following: ? Canned or prepackaged foods. ? Food that is high in trans fat, such as fried foods. ? Food that is high in saturated fat, such as fatty meat. ? Sweets, desserts, sugary drinks, and other foods with added sugar. ? Full-fat dairy products.  Do not salt foods before eating.  Try to eat at least 2 vegetarian meals each week.  Eat more home-cooked  food and less restaurant, buffet, and fast food.  When eating at a restaurant, ask that your food be prepared with less salt or no salt, if possible. What foods are recommended? The items listed may not be a complete list. Talk with your dietitian about what dietary choices are best for you. Grains Whole-grain or whole-wheat bread. Whole-grain or whole-wheat pasta. Brown rice. [place]. Bulgur. Whole-grain and low-sodium cereals. Pita bread. Low-fat, low-sodium crackers. Whole-wheat flour  tortillas. Vegetables Fresh or frozen vegetables (raw, steamed, roasted, or grilled). Low-sodium or reduced-sodium tomato and vegetable juice. Low-sodium or reduced-sodium tomato sauce and tomato paste. Low-sodium or reduced-sodium canned vegetables. Fruits All fresh, dried, or frozen fruit. Canned fruit in natural juice (without added sugar). Meat and other protein foods Skinless chicken or [place]. Ground chicken or [place]. Pork with fat trimmed off. Fish and seafood. Egg whites. Dried beans, peas, or lentils. Unsalted nuts, nut butters, and seeds. Unsalted canned beans. Lean cuts of beef with fat trimmed off. Low-sodium, lean deli meat. Dairy Low-fat (1%) or fat-free (skim) milk. Fat-free, low-fat, or reduced-fat cheeses. Nonfat, low-sodium ricotta or cottage cheese. Low-fat or nonfat yogurt. Low-fat, low-sodium cheese. Fats and oils Soft margarine without trans fats. Vegetable oil. Low-fat, reduced-fat, or light mayonnaise and salad dressings (reduced-sodium). Canola, safflower, olive, soybean, and sunflower oils. Avocado. Seasoning and other foods Herbs. Spices. Seasoning mixes without salt. Unsalted popcorn and pretzels. Fat-free sweets. What foods are not recommended? The items listed may not be a complete list. Talk with your dietitian about what dietary choices are best for you. Grains Baked goods made with fat, such as croissants, muffins, or some breads. Dry pasta or rice meal packs. Vegetables Creamed or fried vegetables. Vegetables in a cheese sauce. Regular canned vegetables (not low-sodium or reduced-sodium). Regular canned tomato sauce and paste (not low-sodium or reduced-sodium). Regular tomato and vegetable juice (not low-sodium or reduced-sodium). Angie Fava. Olives. Fruits Canned fruit in a light or heavy syrup. Fried fruit. Fruit in cream or butter sauce. Meat and other protein foods Fatty cuts of meat. Ribs. Fried meat. Berniece Salines. Sausage. Bologna and other processed lunch meats.  Salami. Fatback. Hotdogs. Bratwurst. Salted nuts and seeds. Canned beans with added salt. Canned or smoked fish. Whole eggs or egg yolks. Chicken or [place] with skin. Dairy Whole or 2% milk, cream, and half-and-half. Whole or full-fat cream cheese. Whole-fat or sweetened yogurt. Full-fat cheese. Nondairy creamers. Whipped toppings. Processed cheese and cheese spreads. Fats and oils Butter. Stick margarine. Lard. Shortening. Ghee. Bacon fat. Tropical oils, such as coconut, palm kernel, or palm oil. Seasoning and other foods Salted popcorn and pretzels. Onion salt, garlic salt, seasoned salt, table salt, and sea salt. Worcestershire sauce. Tartar sauce. Barbecue sauce. Teriyaki sauce. Soy sauce, including reduced-sodium. Steak sauce. Canned and packaged gravies. Fish sauce. Oyster sauce. Cocktail sauce. Horseradish that you find on the shelf. Ketchup. Mustard. Meat flavorings and tenderizers. Bouillon cubes. Hot sauce and Tabasco sauce. Premade or packaged marinades. Premade or packaged taco seasonings. Relishes. Regular salad dressings. Where to find more information:  National Heart, Lung, and [place]: https://wilson-eaton.com/  American Heart Association: www.heart.org Summary  The DASH eating plan is a healthy eating plan that has been shown to reduce high blood pressure (hypertension). It may also reduce your risk for type 2 diabetes, heart disease, and stroke.  With the DASH eating plan, you should limit salt (sodium) intake to 2,300 mg a day. If you have hypertension, you may need to reduce your sodium intake to 1,500 mg  a day.  When on the DASH eating plan, aim to eat more fresh fruits and vegetables, whole grains, lean proteins, low-fat dairy, and heart-healthy fats.  Work with your health care provider or diet and nutrition specialist (dietitian) to adjust your eating plan to your individual calorie needs. This information is not intended to replace advice given to you by your health  care provider. Make sure you discuss any questions you have with your health care provider. Document Released: 01/04/2011 Document Revised: 01/09/2016 Document Reviewed: 01/09/2016 Elsevier Interactive Patient Education  2018 [place] will receive a reminder letter in the mail two months in advance. If you don't receive a letter, please call our office to schedule the follow-up appointment.  Any Other Special Instructions Will Be Listed Below (If Applicable).  If you need a refill on your cardiac medications before your next appointment, please call your pharmacy.

## 2017-07-17 ENCOUNTER — Encounter: Payer: Self-pay | Admitting: Cardiology

## 2017-07-18 DIAGNOSIS — R928 Other abnormal and inconclusive findings on diagnostic imaging of breast: Secondary | ICD-10-CM | POA: Diagnosis not present

## 2017-07-18 DIAGNOSIS — Z1231 Encounter for screening mammogram for malignant neoplasm of breast: Secondary | ICD-10-CM | POA: Diagnosis not present

## 2017-07-22 DIAGNOSIS — M1711 Unilateral primary osteoarthritis, right knee: Secondary | ICD-10-CM | POA: Diagnosis not present

## 2017-07-29 DIAGNOSIS — M1711 Unilateral primary osteoarthritis, right knee: Secondary | ICD-10-CM | POA: Diagnosis not present

## 2017-07-31 DIAGNOSIS — N6311 Unspecified lump in the right breast, upper outer quadrant: Secondary | ICD-10-CM | POA: Diagnosis not present

## 2017-07-31 DIAGNOSIS — R928 Other abnormal and inconclusive findings on diagnostic imaging of breast: Secondary | ICD-10-CM | POA: Diagnosis not present

## 2017-07-31 DIAGNOSIS — N6310 Unspecified lump in the right breast, unspecified quadrant: Secondary | ICD-10-CM | POA: Diagnosis not present

## 2017-08-05 DIAGNOSIS — R69 Illness, unspecified: Secondary | ICD-10-CM | POA: Diagnosis not present

## 2017-08-05 DIAGNOSIS — M1710 Unilateral primary osteoarthritis, unspecified knee: Secondary | ICD-10-CM | POA: Diagnosis not present

## 2017-08-05 DIAGNOSIS — B029 Zoster without complications: Secondary | ICD-10-CM | POA: Diagnosis not present

## 2017-08-05 DIAGNOSIS — Z6839 Body mass index (BMI) 39.0-39.9, adult: Secondary | ICD-10-CM | POA: Diagnosis not present

## 2017-08-05 DIAGNOSIS — E785 Hyperlipidemia, unspecified: Secondary | ICD-10-CM | POA: Diagnosis not present

## 2017-08-05 DIAGNOSIS — Z299 Encounter for prophylactic measures, unspecified: Secondary | ICD-10-CM | POA: Diagnosis not present

## 2017-08-05 DIAGNOSIS — I10 Essential (primary) hypertension: Secondary | ICD-10-CM | POA: Diagnosis not present

## 2017-08-07 DIAGNOSIS — N6489 Other specified disorders of breast: Secondary | ICD-10-CM | POA: Diagnosis not present

## 2017-08-07 DIAGNOSIS — N6311 Unspecified lump in the right breast, upper outer quadrant: Secondary | ICD-10-CM | POA: Diagnosis not present

## 2017-08-22 ENCOUNTER — Other Ambulatory Visit: Payer: Self-pay

## 2017-08-22 ENCOUNTER — Encounter (HOSPITAL_BASED_OUTPATIENT_CLINIC_OR_DEPARTMENT_OTHER)
Admission: RE | Admit: 2017-08-22 | Discharge: 2017-08-22 | Disposition: A | Discharge status: Home / Self Care | Payer: Medicare HMO | Source: Ambulatory Visit | Attending: General Surgery | Admitting: General Surgery

## 2017-08-22 ENCOUNTER — Other Ambulatory Visit: Payer: Self-pay | Admitting: General Surgery

## 2017-08-22 ENCOUNTER — Encounter (HOSPITAL_BASED_OUTPATIENT_CLINIC_OR_DEPARTMENT_OTHER): Payer: Self-pay | Admitting: *Deleted

## 2017-08-22 DIAGNOSIS — Z6839 Body mass index (BMI) 39.0-39.9, adult: Secondary | ICD-10-CM | POA: Diagnosis not present

## 2017-08-22 DIAGNOSIS — N6489 Other specified disorders of breast: Secondary | ICD-10-CM | POA: Diagnosis not present

## 2017-08-22 DIAGNOSIS — Z01812 Encounter for preprocedural laboratory examination: Secondary | ICD-10-CM | POA: Diagnosis not present

## 2017-08-22 DIAGNOSIS — R928 Other abnormal and inconclusive findings on diagnostic imaging of breast: Secondary | ICD-10-CM

## 2017-08-22 DIAGNOSIS — K219 Gastro-esophageal reflux disease without esophagitis: Secondary | ICD-10-CM | POA: Diagnosis not present

## 2017-08-22 DIAGNOSIS — J449 Chronic obstructive pulmonary disease, unspecified: Secondary | ICD-10-CM | POA: Diagnosis not present

## 2017-08-22 DIAGNOSIS — I10 Essential (primary) hypertension: Secondary | ICD-10-CM | POA: Diagnosis not present

## 2017-08-22 LAB — CBC WITH DIFFERENTIAL/PLATELET
Abs Immature Granulocytes: 0 10*3/uL (ref 0.0–0.1)
Basophils Absolute: 0 10*3/uL (ref 0.0–0.1)
Basophils Relative: 1 %
Eosinophils Absolute: 0.1 10*3/uL (ref 0.0–0.7)
Eosinophils Relative: 2 %
HEMATOCRIT: 38.7 % (ref 36.0–46.0)
HEMOGLOBIN: 12.9 g/dL (ref 12.0–15.0)
IMMATURE GRANULOCYTES: 0 %
LYMPHS ABS: 2.1 10*3/uL (ref 0.7–4.0)
LYMPHS PCT: 35 %
MCH: 30.1 pg (ref 26.0–34.0)
MCHC: 33.3 g/dL (ref 30.0–36.0)
MCV: 90.4 fL (ref 78.0–100.0)
MONOS PCT: 7 %
Monocytes Absolute: 0.4 10*3/uL (ref 0.1–1.0)
NEUTROS ABS: 3.4 10*3/uL (ref 1.7–7.7)
NEUTROS PCT: 55 %
Platelets: 179 10*3/uL (ref 150–400)
RBC: 4.28 MIL/uL (ref 3.87–5.11)
RDW: 13.5 % (ref 11.5–15.5)
WBC: 6.1 10*3/uL (ref 4.0–10.5)

## 2017-08-22 LAB — COMPREHENSIVE METABOLIC PANEL
ALT: 18 U/L (ref 0–44)
AST: 22 U/L (ref 15–41)
Albumin: 3.9 g/dL (ref 3.5–5.0)
Alkaline Phosphatase: 47 U/L (ref 38–126)
Anion gap: 8 (ref 5–15)
BILIRUBIN TOTAL: 0.8 mg/dL (ref 0.3–1.2)
BUN: 8 mg/dL (ref 8–23)
CHLORIDE: 102 mmol/L (ref 98–111)
CO2: 28 mmol/L (ref 22–32)
Calcium: 9.3 mg/dL (ref 8.9–10.3)
Creatinine, Ser: 0.94 mg/dL (ref 0.44–1.00)
GFR calc Af Amer: >60 mL/min (ref >60)
GFR calc non Af Amer: >60 mL/min (ref >60)
GLUCOSE: 96 mg/dL (ref 70–99)
POTASSIUM: 4.6 mmol/L (ref 3.5–5.1)
Sodium: 138 mmol/L (ref 135–145)
TOTAL PROTEIN: 7 g/dL (ref 6.5–8.1)

## 2017-08-22 NOTE — Progress Notes (Signed)
Pt given Ensure and instructed to drink by 0445 day of surgery with teach back method. Bring all medications. Had labs drawn today while she was here.

## 2017-08-25 NOTE — H&P (Signed)
Kristin Peters Location: [facility] Patient #: [id number] DOB: [birth year] Married / Language: English / Race: White Female        History of Present Illness       . This is a 67 year old female from [place]. Her husband is with her throughout the encounter. She is referred by Dr. Nolon Nations for evaluation and surgical management of complex sclerosing lesion right breast. Dr. Jerene Bears is her PCP.      She has no prior history of breast problems. Screening mammograms show a small area of distortion in the right breast at the 10 o'clock position. Image guided biopsy showed complex coursing lesion. Excision was recommended by Dr. Nolon Nations. She's done well since the biopsy      Past history consistent with hypertension, hyperlipidemia, controlled anxiety, mild COPD. GERD controlled. Cholecystectomy. Tubal ligation. BMI 39.  Family history is negative for breast or ovarian cancer. Mother died of COPD and brain aneurysm. Father died of heart disease Social history reveals she is married. Second marriage. 2 stepchildren. 2 children of her own. Denies tobacco. Alcohol intake rare. Ligament [place].       We had a long talk. I told her that the statistics are anywhere from 4-10% risk of early cancer but most likely benign. She definitely wants this area excised. She'll be scheduled for right breast lumpectomy with radioactive seed localization. I have discussed the indications, details, techniques, and numerous risk of the surgery with her and her husband. She is aware of the risk of bleeding, infection, cosmetic deformity, chronic pain, reoperation if cancer. She understands these issues. All questions are answered. She agrees with this plan.   Past Surgical History  Breast Biopsy  Right. Foot Surgery  Left. Gallbladder Surgery - Laparoscopic  Tonsillectomy   Diagnostic Studies History  Colonoscopy  5-10 years ago Mammogram   within last year Pap Smear  >5 years ago  Allergies Codeine Phosphate *ANALGESICS - OPIOID*  PredniSONE *CORTICOSTEROIDS*   Medication History AmLODIPine Besylate (5MG  Tablet, Oral) Active. Metoprolol Tartrate (25MG  Tablet, Oral) Active. ALPRAZolam (0.5MG  Tablet, Oral) Active. Citalopram Hydrobromide (20MG  Tablet, Oral) Active. Losartan Potassium (100MG  Tablet, Oral) Active. Omeprazole (20MG  Capsule DR, Oral) Active. Simvastatin (20MG  Tablet, Oral) Active. Symbicort (80-4.5MCG/ACT Aerosol, Inhalation) Active. Medications Reconciled  Social History  Alcohol use  Occasional alcohol use. Caffeine use  Carbonated beverages, Tea. No drug use  Tobacco use  Never smoker.  Family History  Arthritis  Father, Mother. Cerebrovascular Accident  Mother. Heart Disease  Father, Son. Hypertension  Mother. Kidney Disease  Father. Migraine Headache  Mother. Respiratory Condition  Mother.  Pregnancy / Birth History  Age at menarche  58 years. Age of menopause  51-55 Contraceptive History  Oral contraceptives. Gravida  2 Maternal age  53-20 Para  2  Other Problems  Anxiety Disorder  Arthritis  Back Pain  Chronic Obstructive Lung Disease  Depression  Gastroesophageal Reflux Disease  General anesthesia - complications  High blood pressure  Hypercholesterolemia     Review of Systems  General Present- Fatigue. Not Present- Appetite Loss, Chills, Fever, Night Sweats, Weight Gain and Weight Loss. Skin Present- Rash. Not Present- Change in Wart/Mole, Dryness, Hives, Jaundice, New Lesions, Non-Healing Wounds and Ulcer. HEENT Present- Hearing Loss, Seasonal Allergies and Wears glasses/contact lenses. Not Present- Earache, Hoarseness, Nose Bleed, Oral Ulcers, Ringing in the Ears, Sinus Pain, Sore Throat, Visual Disturbances and Yellow Eyes. Respiratory Present- Chronic Cough, Snoring and Wheezing. Not Present- Bloody sputum and Difficulty  Breathing. Breast Present- Breast Pain. Not Present- Breast Mass, Nipple Discharge and Skin Changes. Cardiovascular Present- Leg Cramps and Swelling of Extremities. Not Present- Chest Pain, Difficulty Breathing Lying Down, Palpitations, Rapid Heart Rate and Shortness of Breath. Gastrointestinal Present- Chronic diarrhea and Indigestion. Not Present- Abdominal Pain, Bloating, Bloody Stool, Change in Bowel Habits, Constipation, Difficulty Swallowing, Excessive gas, Gets full quickly at meals, Hemorrhoids, Nausea, Rectal Pain and Vomiting. Female Genitourinary Present- Urgency. Not Present- Frequency, Nocturia, Painful Urination and Pelvic Pain. Musculoskeletal Present- Back Pain, Joint Pain and Swelling of Extremities. Not Present- Joint Stiffness, Muscle Pain and Muscle Weakness. Neurological Not Present- Decreased Memory, Fainting, Headaches, Numbness, Seizures, Tingling, Tremor, Trouble walking and Weakness. Psychiatric Present- Anxiety and Depression. Not Present- Bipolar, Change in Sleep Pattern, Fearful and Frequent crying. Endocrine Not Present- Cold Intolerance, Excessive Hunger, Hair Changes, Heat Intolerance, Hot flashes and New Diabetes. Hematology Present- Easy Bruising. Not Present- Blood Thinners, Excessive bleeding, Gland problems, HIV and Persistent Infections.  Vitals Weight: 220.13 lb Height: 63in Body Surface Area: 2.01 m Body Mass Index: 38.99 kg/m  Temp.: 98.21F(Oral)  Pulse: 56 (Regular)  BP: 120/80 (Sitting, Left Arm, Standard)    Physical Exam  General Mental Status-Alert. General Appearance-Consistent with stated age. Hydration-Well hydrated. Voice-Normal.  Head and Neck Head-normocephalic, atraumatic with no lesions or palpable masses. Trachea-midline. Thyroid Gland Characteristics - normal size and consistency.  Eye Eyeball - Bilateral-Extraocular movements intact. Sclera/Conjunctiva - Bilateral-No scleral icterus.  Chest and  Lung Exam Chest and lung exam reveals -quiet, even and easy respiratory effort with no use of accessory muscles and on auscultation, normal breath sounds, no adventitious sounds and normal vocal resonance. Inspection Chest Wall - Normal. Back - normal.  Breast Note: Breasts are moderately large. Biopsy site on the right noted. No hematoma. No mass. No mass in either breast. No skin changes otherwise. No axillary adenopathy. No supraclavicular adenopathy.   Cardiovascular Cardiovascular examination reveals -normal heart sounds, regular rate and rhythm with no murmurs and normal pedal pulses bilaterally.  Abdomen Inspection Inspection of the abdomen reveals - No Hernias. Skin - Scar - Note: Trocar scars from laparoscopic cholecystectomy. Palpation/Percussion Palpation and Percussion of the abdomen reveal - Soft, Non Tender, No Rebound tenderness, No Rigidity (guarding) and No hepatosplenomegaly. Auscultation Auscultation of the abdomen reveals - Bowel sounds normal.  Neurologic Neurologic evaluation reveals -alert and oriented x 3 with no impairment of recent or remote memory. Mental Status-Normal.  Musculoskeletal Normal Exam - Left-Upper Extremity Strength Normal and Lower Extremity Strength Normal. Normal Exam - Right-Upper Extremity Strength Normal and Lower Extremity Strength Normal.  Lymphatic Head & Neck  General Head & Neck Lymphatics: Bilateral - Description - Normal. Axillary  General Axillary Region: Bilateral - Description - Normal. Tenderness - Non Tender. Femoral & Inguinal  Generalized Femoral & Inguinal Lymphatics: Bilateral - Description - Normal. Tenderness - Non Tender.    Assessment & Plan  ABNORMALITY OF RIGHT BREAST ON SCREENING MAMMOGRAM (R92.8)        Your recent mammograms and biopsy showed a noncancerous abnormality called complex sclerosing lesion Most likely you do not have cancer However there is a 5-10% chance that you have an  early cancer right now Lumpectomy is recommended to prove whether or not you have a cancer You are in agreement     you will be scheduled for right breast lumpectomy with radioactive seed localization I have discussed the indications, techniques, and risks of this surgery in detail I have requested that my office schedule this  surgery next week, if the schedule will allow  HYPERTENSION, ESSENTIAL (I10) COPD, MILD (J44.9) CHRONIC GERD (K21.9) BMI 39.0-39.9,ADULT (Z68.39)    Edsel Petrin. Dalbert Batman, M.D., [facility] General and Minimally invasive Surgery Breast and Colorectal Surgery Office:   [phone] Pager:   [phone]

## 2017-08-28 ENCOUNTER — Ambulatory Visit
Admission: RE | Admit: 2017-08-28 | Discharge: 2017-08-28 | Disposition: A | Discharge status: Home / Self Care | Payer: Medicare HMO | Source: Ambulatory Visit | Attending: General Surgery | Admitting: General Surgery

## 2017-08-28 DIAGNOSIS — R928 Other abnormal and inconclusive findings on diagnostic imaging of breast: Secondary | ICD-10-CM | POA: Diagnosis not present

## 2017-08-30 ENCOUNTER — Encounter (HOSPITAL_BASED_OUTPATIENT_CLINIC_OR_DEPARTMENT_OTHER): Payer: Self-pay | Admitting: Anesthesiology

## 2017-08-30 ENCOUNTER — Other Ambulatory Visit: Payer: Self-pay

## 2017-08-30 ENCOUNTER — Ambulatory Visit (HOSPITAL_BASED_OUTPATIENT_CLINIC_OR_DEPARTMENT_OTHER)
Admission: RE | Admit: 2017-08-30 | Discharge: 2017-08-30 | Disposition: A | Discharge status: Home / Self Care | Payer: Medicare HMO | Source: Ambulatory Visit | Attending: General Surgery | Admitting: General Surgery

## 2017-08-30 ENCOUNTER — Ambulatory Visit
Admission: RE | Admit: 2017-08-30 | Discharge: 2017-08-30 | Disposition: A | Discharge status: Home / Self Care | Payer: Medicare HMO | Source: Ambulatory Visit | Attending: General Surgery | Admitting: General Surgery

## 2017-08-30 ENCOUNTER — Ambulatory Visit (HOSPITAL_BASED_OUTPATIENT_CLINIC_OR_DEPARTMENT_OTHER): Payer: Medicare HMO | Admitting: Anesthesiology

## 2017-08-30 ENCOUNTER — Encounter (HOSPITAL_BASED_OUTPATIENT_CLINIC_OR_DEPARTMENT_OTHER)
Admission: RE | Disposition: A | Discharge status: Home / Self Care | Payer: Self-pay | Source: Ambulatory Visit | Attending: General Surgery

## 2017-08-30 DIAGNOSIS — I10 Essential (primary) hypertension: Secondary | ICD-10-CM | POA: Diagnosis not present

## 2017-08-30 DIAGNOSIS — Z79899 Other long term (current) drug therapy: Secondary | ICD-10-CM | POA: Insufficient documentation

## 2017-08-30 DIAGNOSIS — R928 Other abnormal and inconclusive findings on diagnostic imaging of breast: Secondary | ICD-10-CM | POA: Diagnosis not present

## 2017-08-30 DIAGNOSIS — J449 Chronic obstructive pulmonary disease, unspecified: Secondary | ICD-10-CM | POA: Insufficient documentation

## 2017-08-30 DIAGNOSIS — F419 Anxiety disorder, unspecified: Secondary | ICD-10-CM | POA: Diagnosis not present

## 2017-08-30 DIAGNOSIS — R69 Illness, unspecified: Secondary | ICD-10-CM | POA: Diagnosis not present

## 2017-08-30 DIAGNOSIS — E7800 Pure hypercholesterolemia, unspecified: Secondary | ICD-10-CM | POA: Diagnosis not present

## 2017-08-30 DIAGNOSIS — N6021 Fibroadenosis of right breast: Secondary | ICD-10-CM | POA: Diagnosis not present

## 2017-08-30 DIAGNOSIS — Z885 Allergy status to narcotic agent status: Secondary | ICD-10-CM | POA: Insufficient documentation

## 2017-08-30 DIAGNOSIS — Z888 Allergy status to other drugs, medicaments and biological substances status: Secondary | ICD-10-CM | POA: Insufficient documentation

## 2017-08-30 DIAGNOSIS — K219 Gastro-esophageal reflux disease without esophagitis: Secondary | ICD-10-CM | POA: Diagnosis not present

## 2017-08-30 DIAGNOSIS — Z8249 Family history of ischemic heart disease and other diseases of the circulatory system: Secondary | ICD-10-CM | POA: Diagnosis not present

## 2017-08-30 DIAGNOSIS — M1990 Unspecified osteoarthritis, unspecified site: Secondary | ICD-10-CM | POA: Diagnosis not present

## 2017-08-30 DIAGNOSIS — F329 Major depressive disorder, single episode, unspecified: Secondary | ICD-10-CM | POA: Diagnosis not present

## 2017-08-30 DIAGNOSIS — N6489 Other specified disorders of breast: Secondary | ICD-10-CM | POA: Diagnosis not present

## 2017-08-30 HISTORY — PX: BREAST LUMPECTOMY WITH RADIOACTIVE SEED LOCALIZATION: SHX6424

## 2017-08-30 HISTORY — DX: Other specified postprocedural states: R11.2

## 2017-08-30 HISTORY — DX: Gastro-esophageal reflux disease without esophagitis: K21.9

## 2017-08-30 HISTORY — DX: Depression, unspecified: F32.A

## 2017-08-30 HISTORY — DX: Other abnormal and inconclusive findings on diagnostic imaging of breast: R92.8

## 2017-08-30 HISTORY — DX: Major depressive disorder, single episode, unspecified: F32.9

## 2017-08-30 HISTORY — DX: Chronic obstructive pulmonary disease, unspecified: J44.9

## 2017-08-30 HISTORY — DX: Other specified postprocedural states: Z98.890

## 2017-08-30 SURGERY — BREAST LUMPECTOMY WITH RADIOACTIVE SEED LOCALIZATION
Anesthesia: General | Site: Breast | Laterality: Right

## 2017-08-30 MED ORDER — SCOPOLAMINE 1 MG/3DAYS TD PT72
MEDICATED_PATCH | TRANSDERMAL | Status: AC
  Filled 2017-08-30: qty 1

## 2017-08-30 MED ORDER — FENTANYL CITRATE (PF) 100 MCG/2ML IJ SOLN: 25.0000 ug | INTRAMUSCULAR | Status: DC | PRN

## 2017-08-30 MED ORDER — CHLORHEXIDINE GLUCONATE CLOTH 2 % EX PADS: 6.0000 | MEDICATED_PAD | Freq: Once | CUTANEOUS | Status: DC

## 2017-08-30 MED ORDER — LACTATED RINGERS IV SOLN
INTRAVENOUS | Status: DC
  Administered 2017-08-30 (×2): via INTRAVENOUS

## 2017-08-30 MED ORDER — CELECOXIB 200 MG PO CAPS
200.0000 mg | ORAL_CAPSULE | ORAL | Status: AC
  Administered 2017-08-30: 200 mg via ORAL

## 2017-08-30 MED ORDER — GABAPENTIN 300 MG PO CAPS
300.0000 mg | ORAL_CAPSULE | ORAL | Status: AC
  Administered 2017-08-30: 300 mg via ORAL

## 2017-08-30 MED ORDER — DEXAMETHASONE SODIUM PHOSPHATE 4 MG/ML IJ SOLN
INTRAMUSCULAR | Status: DC | PRN
  Administered 2017-08-30: 10 mg via INTRAVENOUS

## 2017-08-30 MED ORDER — MIDAZOLAM HCL 2 MG/2ML IJ SOLN: 1.0000 mg | INTRAMUSCULAR | Status: DC | PRN

## 2017-08-30 MED ORDER — SCOPOLAMINE 1 MG/3DAYS TD PT72
1.0000 | MEDICATED_PATCH | Freq: Once | TRANSDERMAL | Status: DC | PRN
  Administered 2017-08-30: 1.5 mg via TRANSDERMAL

## 2017-08-30 MED ORDER — CEFAZOLIN SODIUM-DEXTROSE 2-4 GM/100ML-% IV SOLN: 2.0000 g | INTRAVENOUS | Status: DC

## 2017-08-30 MED ORDER — DEXAMETHASONE SODIUM PHOSPHATE 10 MG/ML IJ SOLN
INTRAMUSCULAR | Status: AC
  Filled 2017-08-30: qty 1

## 2017-08-30 MED ORDER — GLYCOPYRROLATE 0.2 MG/ML IJ SOLN
INTRAMUSCULAR | Status: DC | PRN
  Administered 2017-08-30 (×2): 0.1 mg via INTRAVENOUS

## 2017-08-30 MED ORDER — PROPOFOL 10 MG/ML IV BOLUS
INTRAVENOUS | Status: AC
  Filled 2017-08-30: qty 20

## 2017-08-30 MED ORDER — FENTANYL CITRATE (PF) 100 MCG/2ML IJ SOLN: 50.0000 ug | INTRAMUSCULAR | Status: DC | PRN

## 2017-08-30 MED ORDER — MIDAZOLAM HCL 2 MG/2ML IJ SOLN
INTRAMUSCULAR | Status: AC
  Filled 2017-08-30: qty 2

## 2017-08-30 MED ORDER — CEFAZOLIN SODIUM-DEXTROSE 2-4 GM/100ML-% IV SOLN
INTRAVENOUS | Status: AC
  Filled 2017-08-30: qty 100

## 2017-08-30 MED ORDER — HYDROCODONE-ACETAMINOPHEN 5-325 MG PO TABS: 1.0000 | ORAL_TABLET | Freq: Four times a day (QID) | ORAL | 0 refills | Status: DC | PRN

## 2017-08-30 MED ORDER — LIDOCAINE HCL (CARDIAC) PF 100 MG/5ML IV SOSY
PREFILLED_SYRINGE | INTRAVENOUS | Status: AC
  Filled 2017-08-30: qty 5

## 2017-08-30 MED ORDER — 0.9 % SODIUM CHLORIDE (POUR BTL) OPTIME
TOPICAL | Status: DC | PRN
  Administered 2017-08-30: 500 mL

## 2017-08-30 MED ORDER — BUPIVACAINE-EPINEPHRINE (PF) 0.5% -1:200000 IJ SOLN
INTRAMUSCULAR | Status: DC | PRN
  Administered 2017-08-30: 10 mL

## 2017-08-30 MED ORDER — PROPOFOL 10 MG/ML IV BOLUS
INTRAVENOUS | Status: DC | PRN
  Administered 2017-08-30: 180 mg via INTRAVENOUS

## 2017-08-30 MED ORDER — GLYCOPYRROLATE PF 0.2 MG/ML IJ SOSY
PREFILLED_SYRINGE | INTRAMUSCULAR | Status: AC
  Filled 2017-08-30: qty 2

## 2017-08-30 MED ORDER — ACETAMINOPHEN 500 MG PO TABS
1000.0000 mg | ORAL_TABLET | ORAL | Status: AC
  Administered 2017-08-30: 1000 mg via ORAL

## 2017-08-30 MED ORDER — MIDAZOLAM HCL 5 MG/5ML IJ SOLN
INTRAMUSCULAR | Status: DC | PRN
  Administered 2017-08-30: 2 mg via INTRAVENOUS

## 2017-08-30 MED ORDER — EPHEDRINE SULFATE 50 MG/ML IJ SOLN
INTRAMUSCULAR | Status: DC | PRN
  Administered 2017-08-30: 25 mg via INTRAVENOUS

## 2017-08-30 MED ORDER — FENTANYL CITRATE (PF) 100 MCG/2ML IJ SOLN
INTRAMUSCULAR | Status: AC
  Filled 2017-08-30: qty 2

## 2017-08-30 MED ORDER — LIDOCAINE HCL (CARDIAC) PF 100 MG/5ML IV SOSY
PREFILLED_SYRINGE | INTRAVENOUS | Status: DC | PRN
  Administered 2017-08-30: 100 mg via INTRAVENOUS

## 2017-08-30 MED ORDER — GABAPENTIN 300 MG PO CAPS
ORAL_CAPSULE | ORAL | Status: AC
  Filled 2017-08-30: qty 1

## 2017-08-30 MED ORDER — FENTANYL CITRATE (PF) 100 MCG/2ML IJ SOLN
INTRAMUSCULAR | Status: DC | PRN
  Administered 2017-08-30: 100 ug via INTRAVENOUS

## 2017-08-30 MED ORDER — CELECOXIB 200 MG PO CAPS
ORAL_CAPSULE | ORAL | Status: AC
  Filled 2017-08-30: qty 1

## 2017-08-30 MED ORDER — ONDANSETRON HCL 4 MG/2ML IJ SOLN
INTRAMUSCULAR | Status: AC
  Filled 2017-08-30: qty 2

## 2017-08-30 MED ORDER — ACETAMINOPHEN 500 MG PO TABS
ORAL_TABLET | ORAL | Status: AC
  Filled 2017-08-30: qty 2

## 2017-08-30 MED ORDER — PHENYLEPHRINE HCL 10 MG/ML IJ SOLN
INTRAMUSCULAR | Status: DC | PRN
  Administered 2017-08-30: 80 ug via INTRAVENOUS
  Administered 2017-08-30: 40 ug via INTRAVENOUS
  Administered 2017-08-30 (×2): 80 ug via INTRAVENOUS

## 2017-08-30 SURGICAL SUPPLY — 63 items
ADH SKN CLS APL DERMABOND .7 (GAUZE/BANDAGES/DRESSINGS) ×1
APL SKNCLS STERI-STRIP NONHPOA (GAUZE/BANDAGES/DRESSINGS)
APPLIER CLIP 9.375 MED OPEN (MISCELLANEOUS) ×3
APR CLP MED 9.3 20 MLT OPN (MISCELLANEOUS) ×1
BENZOIN TINCTURE PRP APPL 2/3 (GAUZE/BANDAGES/DRESSINGS) IMPLANT
BINDER BREAST LRG (GAUZE/BANDAGES/DRESSINGS) IMPLANT
BINDER BREAST MEDIUM (GAUZE/BANDAGES/DRESSINGS) IMPLANT
BINDER BREAST XLRG (GAUZE/BANDAGES/DRESSINGS) IMPLANT
BINDER BREAST XXLRG (GAUZE/BANDAGES/DRESSINGS) ×3 IMPLANT
BLADE HEX COATED 2.75 (ELECTRODE) ×3 IMPLANT
BLADE SURG 10 STRL SS (BLADE) IMPLANT
BLADE SURG 15 STRL LF DISP TIS (BLADE) ×1 IMPLANT
BLADE SURG 15 STRL SS (BLADE) ×3
CANISTER SUC SOCK COL 7IN (MISCELLANEOUS) IMPLANT
CANISTER SUCT 1200ML W/VALVE (MISCELLANEOUS) ×3 IMPLANT
CHLORAPREP W/TINT 26ML (MISCELLANEOUS) ×3 IMPLANT
CLIP APPLIE 9.375 MED OPEN (MISCELLANEOUS) ×1 IMPLANT
CLOSURE WOUND 1/2 X4 (GAUZE/BANDAGES/DRESSINGS)
COVER BACK TABLE 60X90IN (DRAPES) ×3 IMPLANT
COVER MAYO STAND STRL (DRAPES) ×3 IMPLANT
COVER PROBE W GEL 5X96 (DRAPES) ×3 IMPLANT
DECANTER SPIKE VIAL GLASS SM (MISCELLANEOUS) IMPLANT
DERMABOND ADVANCED (GAUZE/BANDAGES/DRESSINGS) ×2
DERMABOND ADVANCED .7 DNX12 (GAUZE/BANDAGES/DRESSINGS) ×1 IMPLANT
DEVICE DUBIN W/COMP PLATE 8390 (MISCELLANEOUS) ×3 IMPLANT
DRAPE LAPAROSCOPIC ABDOMINAL (DRAPES) ×3 IMPLANT
DRAPE UTILITY XL STRL (DRAPES) ×3 IMPLANT
DRSG PAD ABDOMINAL 8X10 ST (GAUZE/BANDAGES/DRESSINGS) IMPLANT
ELECT REM PT RETURN 9FT ADLT (ELECTROSURGICAL) ×3
ELECTRODE REM PT RTRN 9FT ADLT (ELECTROSURGICAL) ×1 IMPLANT
GAUZE SPONGE 4X4 12PLY STRL LF (GAUZE/BANDAGES/DRESSINGS) ×3 IMPLANT
GLOVE BIO SURGEON STRL SZ7 (GLOVE) ×6 IMPLANT
GLOVE EUDERMIC 7 POWDERFREE (GLOVE) ×3 IMPLANT
GOWN STRL REUS W/ TWL LRG LVL3 (GOWN DISPOSABLE) ×1 IMPLANT
GOWN STRL REUS W/ TWL XL LVL3 (GOWN DISPOSABLE) ×1 IMPLANT
GOWN STRL REUS W/TWL LRG LVL3 (GOWN DISPOSABLE) ×3
GOWN STRL REUS W/TWL XL LVL3 (GOWN DISPOSABLE) ×3
ILLUMINATOR WAVEGUIDE N/F (MISCELLANEOUS) IMPLANT
KIT MARKER MARGIN INK (KITS) ×3 IMPLANT
LIGHT WAVEGUIDE WIDE FLAT (MISCELLANEOUS) IMPLANT
NEEDLE HYPO 25X1 1.5 SAFETY (NEEDLE) ×3 IMPLANT
NS IRRIG 1000ML POUR BTL (IV SOLUTION) ×3 IMPLANT
PACK BASIN DAY SURGERY FS (CUSTOM PROCEDURE TRAY) ×3 IMPLANT
PENCIL BUTTON HOLSTER BLD 10FT (ELECTRODE) ×3 IMPLANT
SHEET MEDIUM DRAPE 40X70 STRL (DRAPES) IMPLANT
SLEEVE SCD COMPRESS KNEE MED (MISCELLANEOUS) ×3 IMPLANT
SPONGE LAP 18X18 RF (DISPOSABLE) ×3 IMPLANT
SPONGE LAP 4X18 RFD (DISPOSABLE) IMPLANT
STRIP CLOSURE SKIN 1/2X4 (GAUZE/BANDAGES/DRESSINGS) IMPLANT
SUT ETHILON 3 0 FSL (SUTURE) IMPLANT
SUT MNCRL AB 4-0 PS2 18 (SUTURE) ×3 IMPLANT
SUT SILK 2 0 SH (SUTURE) ×3 IMPLANT
SUT VIC AB 2-0 CT1 27 (SUTURE)
SUT VIC AB 2-0 CT1 TAPERPNT 27 (SUTURE) IMPLANT
SUT VIC AB 3-0 SH 27 (SUTURE)
SUT VIC AB 3-0 SH 27X BRD (SUTURE) IMPLANT
SUT VICRYL 3-0 CR8 SH (SUTURE) ×3 IMPLANT
SYR 10ML LL (SYRINGE) ×3 IMPLANT
TOWEL GREEN STERILE FF (TOWEL DISPOSABLE) ×3 IMPLANT
TOWEL OR NON WOVEN STRL DISP B (DISPOSABLE) IMPLANT
TUBE CONNECTING 20'X1/4 (TUBING) ×1
TUBE CONNECTING 20X1/4 (TUBING) ×2 IMPLANT
YANKAUER SUCT BULB TIP NO VENT (SUCTIONS) ×3 IMPLANT

## 2017-08-30 NOTE — Discharge Instructions (Signed)
[place] Office Phone Number [phone]  BREAST BIOPSY/ PARTIAL MASTECTOMY: POST OP INSTRUCTIONS  Always review your discharge instruction sheet given to you by the facility where your surgery was performed.  IF YOU HAVE DISABILITY OR FAMILY LEAVE FORMS, YOU MUST BRING THEM TO THE OFFICE FOR PROCESSING.  DO NOT GIVE THEM TO YOUR DOCTOR.  1. A prescription for pain medication may be given to you upon discharge.  Take your pain medication as prescribed, if needed.  If narcotic pain medicine is not needed, then you may take acetaminophen (Tylenol) or ibuprofen (Advil) as needed. No Tylenol until 1:30pm. No ibuprofen until 3:30pm. 2. Take your usually prescribed medications unless otherwise directed 3. If you need a refill on your pain medication, please contact your pharmacy.  They will contact our office to request authorization.  Prescriptions will not be filled after 5pm or on week-ends. 4. You should eat very light the first 24 hours after surgery, such as soup, crackers, pudding, etc.  Resume your normal diet the day after surgery. 5. Most patients will experience some swelling and bruising in the breast.  Ice packs and a good support bra will help.  Swelling and bruising can take several days to resolve.  6. It is common to experience some constipation if taking pain medication after surgery.  Increasing fluid intake and taking a stool softener will usually help or prevent this problem from occurring.  A mild laxative (Milk of Magnesia or Miralax) should be taken according to package directions if there are no bowel movements after 48 hours. 7. Unless discharge instructions indicate otherwise, you may remove your bandages 24-48 hours after surgery, and you may shower at that time.  You may have steri-strips (small skin tapes) in place directly over the incision.  These strips should be left on the skin for 7-10 days.  If your surgeon used skin glue on the incision, you may  shower in 24 hours.  The glue will flake off over the next 2-3 weeks.  Any sutures or staples will be removed at the office during your follow-up visit. 8. ACTIVITIES:  You may resume regular daily activities (gradually increasing) beginning the next day.  Wearing a good support bra or sports bra minimizes pain and swelling.  You may have sexual intercourse when it is comfortable. a. You may drive when you no longer are taking prescription pain medication, you can comfortably wear a seatbelt, and you can safely maneuver your car and apply brakes. b. RETURN TO WORK:  ______________________________________________________________________________________ 9. You should see your doctor in the office for a follow-up appointment approximately two weeks after your surgery.  Your doctors nurse will typically make your follow-up appointment when she calls you with your pathology report.  Expect your pathology report 2-3 business days after your surgery.  You may call to check if you do not hear from [place] after three days. 10. OTHER INSTRUCTIONS: _______________________________________________________________________________________________ _____________________________________________________________________________________________________________________________________ _____________________________________________________________________________________________________________________________________ _____________________________________________________________________________________________________________________________________  WHEN TO CALL YOUR DOCTOR: 1. Fever over 101.0 2. Nausea and/or vomiting. 3. Extreme swelling or bruising. 4. Continued bleeding from incision. 5. Increased pain, redness, or drainage from the incision.  The clinic staff is available to answer your questions during regular business hours.  Please dont hesitate to call and ask to speak to one of the nurses for clinical concerns.   If you have a medical emergency, go to the nearest emergency room or call 911.  A surgeon from [facility] is always on call at the hospital.  For further questions, please visit [email or web address]     Post Anesthesia Home Care Instructions  Activity: Get plenty of rest for the remainder of the day. A responsible individual must stay with you for 24 hours following the procedure.  For the next 24 hours, DO NOT: -Drive a car -Paediatric nurse -Drink alcoholic beverages -Take any medication unless instructed by your physician -Make any legal decisions or sign important papers.  Meals: Start with liquid foods such as gelatin or soup. Progress to regular foods as tolerated. Avoid greasy, spicy, heavy foods. If nausea and/or vomiting occur, drink only clear liquids until the nausea and/or vomiting subsides. Call your physician if vomiting continues.  Special Instructions/Symptoms: Your throat may feel dry or sore from the anesthesia or the breathing tube placed in your throat during surgery. If this causes discomfort, gargle with warm salt water. The discomfort should disappear within 24 hours.  If you had a scopolamine patch placed behind your ear for the management of post- operative nausea and/or vomiting:  1. The medication in the patch is effective for 72 hours, after which it should be removed.  Wrap patch in a tissue and discard in the trash. Wash hands thoroughly with soap and water. 2. You may remove the patch earlier than 72 hours if you experience unpleasant side effects which may include dry mouth, dizziness or visual disturbances. 3. Avoid touching the patch. Wash your hands with soap and water after contact with the patch.

## 2017-08-30 NOTE — Anesthesia Postprocedure Evaluation (Signed)
Anesthesia Post Note  Patient: Kristin Peters  Procedure(s) Performed: BREAST LUMPECTOMY WITH RADIOACTIVE SEED LOCALIZATION (Right Breast)     Patient location during evaluation: PACU Anesthesia Type: General Level of consciousness: awake Pain management: pain level controlled Respiratory status: spontaneous breathing Cardiovascular status: stable Anesthetic complications: no    Last Vitals:  Vitals:   08/30/17 1000 08/30/17 1015  BP: 115/60 111/74  Pulse: 85 88  Resp: 16 16  Temp:    SpO2: 100% 100%    Last Pain:  Vitals:   08/30/17 1015  TempSrc:   PainSc: 0-No pain                 Shareese Macha

## 2017-08-30 NOTE — Transfer of Care (Signed)
Immediate Anesthesia Transfer of Care Note  Patient: Kristin Peters [place]  Procedure(s) Performed: BREAST LUMPECTOMY WITH RADIOACTIVE SEED LOCALIZATION (Right Breast)  Patient Location: PACU  Anesthesia Type:General  Level of Consciousness: awake and patient cooperative  Airway & Oxygen Therapy: Patient Spontanous Breathing and Patient connected to face mask oxygen  Post-op Assessment: Report given to RN and Post -op Vital signs reviewed and stable  Post vital signs: Reviewed and stable  Last Vitals:  Vitals Value Taken Time  BP    Temp    Pulse 88 08/30/2017  9:50 AM  Resp    SpO2 100 % 08/30/2017  9:50 AM  Vitals shown include unvalidated device data.  Last Pain:  Vitals:   08/30/17 0718  TempSrc: Oral  PainSc: 0-No pain         Complications: No apparent anesthesia complications

## 2017-08-30 NOTE — Anesthesia Preprocedure Evaluation (Signed)
Anesthesia Evaluation  Patient identified by MRN, date of birth, ID band Patient awake    Reviewed: Allergy & Precautions, NPO status   Airway Mallampati: II  TM Distance: >3 FB     Dental   Pulmonary COPD,    breath sounds clear to auscultation       Cardiovascular hypertension,  Rhythm:Regular Rate:Normal     Neuro/Psych    GI/Hepatic Neg liver ROS, GERD  ,  Endo/Other  negative endocrine ROS  Renal/GU negative Renal ROS     Musculoskeletal  (+) Arthritis ,   Abdominal   Peds  Hematology   Anesthesia Other Findings   Reproductive/Obstetrics                             Anesthesia Physical Anesthesia Plan  ASA: III  Anesthesia Plan: General   Post-op Pain Management:    Induction: Intravenous  PONV Risk Score and Plan: Treatment may vary due to age or medical condition and Midazolam  Airway Management Planned: LMA  Additional Equipment:   Intra-op Plan:   Post-operative Plan: Extubation in OR  Informed Consent: I have reviewed the patients History and Physical, chart, labs and discussed the procedure including the risks, benefits and alternatives for the proposed anesthesia with the patient or authorized representative who has indicated his/her understanding and acceptance.   Dental advisory given  Plan Discussed with: Anesthesiologist and CRNA  Anesthesia Plan Comments:         Anesthesia Quick Evaluation

## 2017-08-30 NOTE — Op Note (Signed)
Patient Name:           Kristin Peters   Date of Surgery:        08/30/2017  Pre op Diagnosis:      Complex sclerosing lesion right breast, upper outer quadrant  Post op Diagnosis:    Same  Procedure:                 Right breast lumpectomy with radioactive seed localization  Surgeon:                     Edsel Petrin. Dalbert Batman, M.D., FACS  Assistant:                      OR staff  Operative Indications:    This is a 67 year old female from [place].  She is referred by Dr. Nolon Nations for evaluation and surgical management of complex sclerosing lesion right breast. Dr. Jerene Bears is her PCP.      She has no prior history of breast problems. Screening mammograms show a small area of distortion in the right breast at the 10 o'clock position. Image guided biopsy showed complex sclerosing lesion. Excision was recommended by Dr. Nolon Nations. .  Family history is negative for breast or ovarian cancer. .       We had a long talk. She definitely wants this area excised. She'll be scheduled for right breast lumpectomy with radioactive seed localization.     Operative Findings:       The original biopsy clip and the radioactive seed were immediately adjacent to each other in the upper outer quadrant.  The specimen mammogram looked good, containing both seed and original biopsy clip in the relative center of the specimen.  There was no gross palpable abnormality.  Procedure in Detail:         Following the induction of general LMA anesthesia the patient's right breast was prepped and draped in a sterile fashion.  Surgical timeout was performed.  Intravenous antibiotics were given.  0.5% Marcaine with epinephrine was used as a local infiltration anesthetic. Using the neoprobe I localized the radioactive signal in the upper outer quadrant.    A curvilinear skin crease incision was made.  The lumpectomy was performed with the neoprobe and electrocautery.  The specimen was removed and  marked with silk sutures and a 6 color ink kit to orient the pathologist.  Specimen mammogram looked good as described above.  The specimen was sent to the lab where the seed was retrieved.  Hemostasis was excellent.  The wound was irrigated.  5 metal marker clips were placed in the walls of the lumpectomy cavity.  The breast tissues were reapproximated with several sutures of 3-0 Vicryl and the skin closed with a running subcuticular 4-0 Monocryl and Dermabond.  Dry bandages and a breast binder were placed.  The patient tolerated the procedure well and was taken to PACU in stable condition.  EBL 10 cc.  Counts correct.  Complications none.   Addendum: I logged onto the [employer] and reviewed her prescription medication history     Raydan Schlabach M. Dalbert Batman, M.D., FACS General and Minimally Invasive Surgery Breast and Colorectal Surgery  08/30/2017 9:47 AM

## 2017-08-30 NOTE — Anesthesia Procedure Notes (Signed)
Procedure Name: LMA Insertion Date/Time: 08/30/2017 9:01 AM Performed by: Marrianne Mood, CRNA Pre-anesthesia Checklist: Patient identified, Emergency Drugs available, Suction available, Patient being monitored and Timeout performed Patient Re-evaluated:Patient Re-evaluated prior to induction Oxygen Delivery Method: Circle system utilized Preoxygenation: Pre-oxygenation with 100% oxygen Induction Type: IV induction Ventilation: Mask ventilation without difficulty LMA: LMA inserted LMA Size: 4.0 Number of attempts: 1 Airway Equipment and Method: Bite block Placement Confirmation: positive ETCO2 Tube secured with: Tape Dental Injury: Teeth and Oropharynx as per pre-operative assessment

## 2017-08-30 NOTE — Interval H&P Note (Signed)
History and Physical Interval Note:  08/30/2017 7:06 AM  Kristin Peters  has presented today for surgery, with the diagnosis of COMPLEX SCELEROSING LESION RIGHT BREAST  The various methods of treatment have been discussed with the patient and family. After consideration of risks, benefits and other options for treatment, the patient has consented to  Procedure(s): BREAST LUMPECTOMY WITH RADIOACTIVE SEED LOCALIZATION (Right) as a surgical intervention .  The patient's history has been reviewed, patient examined, no change in status, stable for surgery.  I have reviewed the patient's chart and labs.  Questions were answered to the patient's satisfaction.     Adin Hector

## 2017-09-02 ENCOUNTER — Encounter (HOSPITAL_BASED_OUTPATIENT_CLINIC_OR_DEPARTMENT_OTHER): Payer: Self-pay | Admitting: General Surgery

## 2017-09-02 NOTE — Progress Notes (Signed)
Inform patient of Pathology report,. 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

## 2017-10-24 DIAGNOSIS — M2032 Hallux varus (acquired), left foot: Secondary | ICD-10-CM | POA: Diagnosis not present

## 2017-10-29 DIAGNOSIS — Z299 Encounter for prophylactic measures, unspecified: Secondary | ICD-10-CM | POA: Diagnosis not present

## 2017-10-29 DIAGNOSIS — I10 Essential (primary) hypertension: Secondary | ICD-10-CM | POA: Diagnosis not present

## 2017-10-29 DIAGNOSIS — R69 Illness, unspecified: Secondary | ICD-10-CM | POA: Diagnosis not present

## 2017-10-29 DIAGNOSIS — Z6838 Body mass index (BMI) 38.0-38.9, adult: Secondary | ICD-10-CM | POA: Diagnosis not present

## 2017-10-30 DIAGNOSIS — E7800 Pure hypercholesterolemia, unspecified: Secondary | ICD-10-CM | POA: Diagnosis not present

## 2017-10-30 DIAGNOSIS — M159 Polyosteoarthritis, unspecified: Secondary | ICD-10-CM | POA: Diagnosis not present

## 2017-10-30 DIAGNOSIS — I10 Essential (primary) hypertension: Secondary | ICD-10-CM | POA: Diagnosis not present

## 2017-11-12 DIAGNOSIS — M8580 Other specified disorders of bone density and structure, unspecified site: Secondary | ICD-10-CM | POA: Diagnosis not present

## 2017-11-12 DIAGNOSIS — M25561 Pain in right knee: Secondary | ICD-10-CM | POA: Diagnosis not present

## 2017-11-12 DIAGNOSIS — I10 Essential (primary) hypertension: Secondary | ICD-10-CM | POA: Diagnosis not present

## 2017-11-12 DIAGNOSIS — Z299 Encounter for prophylactic measures, unspecified: Secondary | ICD-10-CM | POA: Diagnosis not present

## 2017-11-12 DIAGNOSIS — Z23 Encounter for immunization: Secondary | ICD-10-CM | POA: Diagnosis not present

## 2017-11-12 DIAGNOSIS — Z6838 Body mass index (BMI) 38.0-38.9, adult: Secondary | ICD-10-CM | POA: Diagnosis not present

## 2017-11-12 DIAGNOSIS — M1710 Unilateral primary osteoarthritis, unspecified knee: Secondary | ICD-10-CM | POA: Diagnosis not present

## 2017-11-25 DIAGNOSIS — M7061 Trochanteric bursitis, right hip: Secondary | ICD-10-CM | POA: Diagnosis not present

## 2017-11-25 DIAGNOSIS — M25551 Pain in right hip: Secondary | ICD-10-CM | POA: Diagnosis not present

## 2017-11-25 DIAGNOSIS — M461 Sacroiliitis, not elsewhere classified: Secondary | ICD-10-CM | POA: Diagnosis not present

## 2017-11-25 DIAGNOSIS — R69 Illness, unspecified: Secondary | ICD-10-CM | POA: Diagnosis not present

## 2017-11-25 DIAGNOSIS — Z299 Encounter for prophylactic measures, unspecified: Secondary | ICD-10-CM | POA: Diagnosis not present

## 2017-11-25 DIAGNOSIS — Z6837 Body mass index (BMI) 37.0-37.9, adult: Secondary | ICD-10-CM | POA: Diagnosis not present

## 2017-11-25 DIAGNOSIS — I10 Essential (primary) hypertension: Secondary | ICD-10-CM | POA: Diagnosis not present

## 2017-11-27 DIAGNOSIS — M1710 Unilateral primary osteoarthritis, unspecified knee: Secondary | ICD-10-CM | POA: Diagnosis not present

## 2017-11-27 DIAGNOSIS — R69 Illness, unspecified: Secondary | ICD-10-CM | POA: Diagnosis not present

## 2017-11-27 DIAGNOSIS — Z6837 Body mass index (BMI) 37.0-37.9, adult: Secondary | ICD-10-CM | POA: Diagnosis not present

## 2017-11-27 DIAGNOSIS — Z299 Encounter for prophylactic measures, unspecified: Secondary | ICD-10-CM | POA: Diagnosis not present

## 2017-11-27 DIAGNOSIS — I10 Essential (primary) hypertension: Secondary | ICD-10-CM | POA: Diagnosis not present

## 2017-11-27 DIAGNOSIS — M7061 Trochanteric bursitis, right hip: Secondary | ICD-10-CM | POA: Diagnosis not present

## 2018-01-30 DIAGNOSIS — R69 Illness, unspecified: Secondary | ICD-10-CM | POA: Diagnosis not present

## 2018-01-30 DIAGNOSIS — Z6838 Body mass index (BMI) 38.0-38.9, adult: Secondary | ICD-10-CM | POA: Diagnosis not present

## 2018-01-30 DIAGNOSIS — Z299 Encounter for prophylactic measures, unspecified: Secondary | ICD-10-CM | POA: Diagnosis not present

## 2018-01-30 DIAGNOSIS — E785 Hyperlipidemia, unspecified: Secondary | ICD-10-CM | POA: Diagnosis not present

## 2018-01-30 DIAGNOSIS — H6090 Unspecified otitis externa, unspecified ear: Secondary | ICD-10-CM | POA: Diagnosis not present

## 2018-01-30 DIAGNOSIS — K589 Irritable bowel syndrome without diarrhea: Secondary | ICD-10-CM | POA: Diagnosis not present

## 2018-01-30 DIAGNOSIS — I10 Essential (primary) hypertension: Secondary | ICD-10-CM | POA: Diagnosis not present

## 2018-02-06 DIAGNOSIS — I10 Essential (primary) hypertension: Secondary | ICD-10-CM | POA: Diagnosis not present

## 2018-02-06 DIAGNOSIS — M159 Polyosteoarthritis, unspecified: Secondary | ICD-10-CM | POA: Diagnosis not present

## 2018-02-06 DIAGNOSIS — E7800 Pure hypercholesterolemia, unspecified: Secondary | ICD-10-CM | POA: Diagnosis not present

## 2018-02-17 DIAGNOSIS — Z299 Encounter for prophylactic measures, unspecified: Secondary | ICD-10-CM | POA: Diagnosis not present

## 2018-02-17 DIAGNOSIS — I10 Essential (primary) hypertension: Secondary | ICD-10-CM | POA: Diagnosis not present

## 2018-02-17 DIAGNOSIS — M25561 Pain in right knee: Secondary | ICD-10-CM | POA: Diagnosis not present

## 2018-02-17 DIAGNOSIS — M170 Bilateral primary osteoarthritis of knee: Secondary | ICD-10-CM | POA: Diagnosis not present

## 2018-02-17 DIAGNOSIS — Z6838 Body mass index (BMI) 38.0-38.9, adult: Secondary | ICD-10-CM | POA: Diagnosis not present

## 2018-02-17 DIAGNOSIS — M25562 Pain in left knee: Secondary | ICD-10-CM | POA: Diagnosis not present

## 2018-02-17 DIAGNOSIS — R269 Unspecified abnormalities of gait and mobility: Secondary | ICD-10-CM | POA: Diagnosis not present

## 2018-02-18 DIAGNOSIS — Z299 Encounter for prophylactic measures, unspecified: Secondary | ICD-10-CM | POA: Diagnosis not present

## 2018-02-18 DIAGNOSIS — M1710 Unilateral primary osteoarthritis, unspecified knee: Secondary | ICD-10-CM | POA: Diagnosis not present

## 2018-02-18 DIAGNOSIS — Z6838 Body mass index (BMI) 38.0-38.9, adult: Secondary | ICD-10-CM | POA: Diagnosis not present

## 2018-02-18 DIAGNOSIS — R69 Illness, unspecified: Secondary | ICD-10-CM | POA: Diagnosis not present

## 2018-02-18 DIAGNOSIS — M7061 Trochanteric bursitis, right hip: Secondary | ICD-10-CM | POA: Diagnosis not present

## 2018-02-18 DIAGNOSIS — I10 Essential (primary) hypertension: Secondary | ICD-10-CM | POA: Diagnosis not present

## 2018-03-04 DIAGNOSIS — I10 Essential (primary) hypertension: Secondary | ICD-10-CM | POA: Diagnosis not present

## 2018-03-04 DIAGNOSIS — M159 Polyosteoarthritis, unspecified: Secondary | ICD-10-CM | POA: Diagnosis not present

## 2018-03-04 DIAGNOSIS — E7800 Pure hypercholesterolemia, unspecified: Secondary | ICD-10-CM | POA: Diagnosis not present

## 2018-03-12 DIAGNOSIS — Z789 Other specified health status: Secondary | ICD-10-CM | POA: Diagnosis not present

## 2018-03-12 DIAGNOSIS — Z6838 Body mass index (BMI) 38.0-38.9, adult: Secondary | ICD-10-CM | POA: Diagnosis not present

## 2018-03-12 DIAGNOSIS — Z299 Encounter for prophylactic measures, unspecified: Secondary | ICD-10-CM | POA: Diagnosis not present

## 2018-03-12 DIAGNOSIS — I10 Essential (primary) hypertension: Secondary | ICD-10-CM | POA: Diagnosis not present

## 2018-04-14 DIAGNOSIS — J309 Allergic rhinitis, unspecified: Secondary | ICD-10-CM | POA: Diagnosis not present

## 2018-04-14 DIAGNOSIS — I10 Essential (primary) hypertension: Secondary | ICD-10-CM | POA: Diagnosis not present

## 2018-04-14 DIAGNOSIS — Z6838 Body mass index (BMI) 38.0-38.9, adult: Secondary | ICD-10-CM | POA: Diagnosis not present

## 2018-04-14 DIAGNOSIS — R05 Cough: Secondary | ICD-10-CM | POA: Diagnosis not present

## 2018-04-14 DIAGNOSIS — Z299 Encounter for prophylactic measures, unspecified: Secondary | ICD-10-CM | POA: Diagnosis not present

## 2018-04-30 DIAGNOSIS — Z6839 Body mass index (BMI) 39.0-39.9, adult: Secondary | ICD-10-CM | POA: Diagnosis not present

## 2018-04-30 DIAGNOSIS — Z299 Encounter for prophylactic measures, unspecified: Secondary | ICD-10-CM | POA: Diagnosis not present

## 2018-04-30 DIAGNOSIS — R69 Illness, unspecified: Secondary | ICD-10-CM | POA: Diagnosis not present

## 2018-04-30 DIAGNOSIS — I10 Essential (primary) hypertension: Secondary | ICD-10-CM | POA: Diagnosis not present

## 2018-04-30 DIAGNOSIS — Z713 Dietary counseling and surveillance: Secondary | ICD-10-CM | POA: Diagnosis not present

## 2018-05-05 DIAGNOSIS — I10 Essential (primary) hypertension: Secondary | ICD-10-CM | POA: Diagnosis not present

## 2018-05-05 DIAGNOSIS — M159 Polyosteoarthritis, unspecified: Secondary | ICD-10-CM | POA: Diagnosis not present

## 2018-05-05 DIAGNOSIS — E7800 Pure hypercholesterolemia, unspecified: Secondary | ICD-10-CM | POA: Diagnosis not present

## 2018-05-14 DIAGNOSIS — Z1331 Encounter for screening for depression: Secondary | ICD-10-CM | POA: Diagnosis not present

## 2018-05-14 DIAGNOSIS — Z7189 Other specified counseling: Secondary | ICD-10-CM | POA: Diagnosis not present

## 2018-05-14 DIAGNOSIS — Z1339 Encounter for screening examination for other mental health and behavioral disorders: Secondary | ICD-10-CM | POA: Diagnosis not present

## 2018-05-14 DIAGNOSIS — E7800 Pure hypercholesterolemia, unspecified: Secondary | ICD-10-CM | POA: Diagnosis not present

## 2018-05-14 DIAGNOSIS — Z1211 Encounter for screening for malignant neoplasm of colon: Secondary | ICD-10-CM | POA: Diagnosis not present

## 2018-05-14 DIAGNOSIS — Z0000 Encounter for general adult medical examination without abnormal findings: Secondary | ICD-10-CM | POA: Diagnosis not present

## 2018-05-14 DIAGNOSIS — I10 Essential (primary) hypertension: Secondary | ICD-10-CM | POA: Diagnosis not present

## 2018-05-14 DIAGNOSIS — R5383 Other fatigue: Secondary | ICD-10-CM | POA: Diagnosis not present

## 2018-05-14 DIAGNOSIS — Z79899 Other long term (current) drug therapy: Secondary | ICD-10-CM | POA: Diagnosis not present

## 2018-05-14 DIAGNOSIS — Z299 Encounter for prophylactic measures, unspecified: Secondary | ICD-10-CM | POA: Diagnosis not present

## 2018-05-14 DIAGNOSIS — Z6837 Body mass index (BMI) 37.0-37.9, adult: Secondary | ICD-10-CM | POA: Diagnosis not present

## 2018-07-01 DIAGNOSIS — M2032 Hallux varus (acquired), left foot: Secondary | ICD-10-CM | POA: Diagnosis not present

## 2018-07-01 DIAGNOSIS — M25572 Pain in left ankle and joints of left foot: Secondary | ICD-10-CM | POA: Diagnosis not present

## 2018-07-16 ENCOUNTER — Telehealth: Payer: Self-pay | Admitting: Cardiology

## 2018-07-16 NOTE — Telephone Encounter (Signed)
Virtual Visit Pre-Appointment Phone Call  "(Name), I am calling you today to discuss your upcoming appointment. We are currently trying to limit exposure to the virus that causes COVID-19 by seeing patients at home rather than in the office."  1. "What is the BEST phone number to call the day of the visit?" - include this in appointment notes  2. Do you have or have access to (through a family member/friend) a smartphone with video capability that we can use for your visit?" a. If yes - list this number in appt notes as cell (if different from BEST phone #) and list the appointment type as a VIDEO visit in appointment notes b. If no - list the appointment type as a PHONE visit in appointment notes  3. Confirm consent - "In the setting of the current Covid19 crisis, you are scheduled for a (phone or video) visit with your provider on (date) at (time).  Just as we do with many in-office visits, in order for you to participate in this visit, we must obtain consent.  If you'd like, I can send this to your mychart (if signed up) or email for you to review.  Otherwise, I can obtain your verbal consent now.  All virtual visits are billed to your insurance company just like a normal visit would be.  By agreeing to a virtual visit, we'd like you to understand that the technology does not allow for your provider to perform an examination, and thus may limit your provider's ability to fully assess your condition. If your provider identifies any concerns that need to be evaluated in person, we will make arrangements to do so.  Finally, though the technology is pretty good, we cannot assure that it will always work on either your or our end, and in the setting of a video visit, we may have to convert it to a phone-only visit.  In either situation, we cannot ensure that we have a secure connection.  Are you willing to proceed?" STAFF: Did the patient verbally acknowledge consent to telehealth visit? Document  YES/NO here: yes  4. Advise patient to be prepared - "Two hours prior to your appointment, go ahead and check your blood pressure, pulse, oxygen saturation, and your weight (if you have the equipment to check those) and write them all down. When your visit starts, your provider will ask you for this information. If you have an Apple Watch or Kardia device, please plan to have heart rate information ready on the day of your appointment. Please have a pen and paper handy nearby the day of the visit as well."  5. Give patient instructions for MyChart download to smartphone OR Doximity/Doxy.me as below if video visit (depending on what platform provider is using)  6. Inform patient they will receive a phone call 15 minutes prior to their appointment time (may be from unknown caller ID) so they should be prepared to answer    TELEPHONE CALL NOTE  Kristin Peters has been deemed a candidate for a follow-up tele-health visit to limit community exposure during the Covid-19 pandemic. I spoke with the patient via phone to ensure availability of phone/video source, confirm preferred email & phone number, and discuss instructions and expectations.  I reminded Kristin Peters to be prepared with any vital sign and/or heart rhythm information that could potentially be obtained via home monitoring, at the time of her visit. I reminded Kristin Peters to expect a phone call prior to  her visit.  Kristin Peters 07/16/2018 3:44 PM   INSTRUCTIONS FOR DOWNLOADING THE MYCHART APP TO SMARTPHONE  - The patient must first make sure to have activated MyChart and know their login information - If Apple, go to [employer] and type in MyChart in the search bar and download the app. If Android, ask patient to go to [employer] and type in [place] in the search bar and download the app. The app is free but as with any other app downloads, their phone may require them to verify saved payment information or Apple/Android  password.  - The patient will need to then log into the app with their MyChart username and password, and select [facility] as their healthcare provider to link the account. When it is time for your visit, go to the MyChart app, find appointments, and click Begin Video Visit. Be sure to Select Allow for your device to access the Microphone and Camera for your visit. You will then be connected, and your provider will be with you shortly.  **If they have any issues connecting, or need assistance please contact MyChart service desk [phone])**  **If using a computer, in order to ensure the best quality for their visit they will need to use either of the following Internet Browsers: [employer], or Google Chrome**  IF USING DOXIMITY or DOXY.ME - The patient will receive a link just prior to their visit by text.     FULL LENGTH CONSENT FOR TELE-HEALTH VISIT   I hereby voluntarily request, consent and authorize [facility] and its employed or contracted physicians, physician assistants, nurse practitioners or other licensed health care professionals (the Practitioner), to provide me with telemedicine health care services (the Services") as deemed necessary by the treating Practitioner. I acknowledge and consent to receive the Services by the Practitioner via telemedicine. I understand that the telemedicine visit will involve communicating with the Practitioner through live audiovisual communication technology and the disclosure of certain medical information by electronic transmission. I acknowledge that I have been given the opportunity to request an in-person assessment or other available alternative prior to the telemedicine visit and am voluntarily participating in the telemedicine visit.  I understand that I have the right to withhold or withdraw my consent to the use of telemedicine in the course of my care at any time, without affecting my right to future care or treatment,  and that the Practitioner or I may terminate the telemedicine visit at any time. I understand that I have the right to inspect all information obtained and/or recorded in the course of the telemedicine visit and may receive copies of available information for a reasonable fee.  I understand that some of the potential risks of receiving the Services via telemedicine include:   Delay or interruption in medical evaluation due to technological equipment failure or disruption;  Information transmitted may not be sufficient (e.g. poor resolution of images) to allow for appropriate medical decision making by the Practitioner; and/or   In rare instances, security protocols could fail, causing a breach of personal health information.  Furthermore, I acknowledge that it is my responsibility to provide information about my medical history, conditions and care that is complete and accurate to the best of my ability. I acknowledge that Practitioner's advice, recommendations, and/or decision may be based on factors not within their control, such as incomplete or inaccurate data provided by me or distortions of diagnostic images or specimens that may result from electronic transmissions. I  understand that the practice of medicine is not an exact science and that Practitioner makes no warranties or guarantees regarding treatment outcomes. I acknowledge that I will receive a copy of this consent concurrently upon execution via email to the email address I last provided but may also request a printed copy by calling the office of [facility].    I understand that my insurance will be billed for this visit.   I have read or had this consent read to me.  I understand the contents of this consent, which adequately explains the benefits and risks of the Services being provided via telemedicine.   I have been provided ample opportunity to ask questions regarding this consent and the Services and have had my questions  answered to my satisfaction.  I give my informed consent for the services to be provided through the use of telemedicine in my medical care  By participating in this telemedicine visit I agree to the above.

## 2018-07-21 DIAGNOSIS — E7800 Pure hypercholesterolemia, unspecified: Secondary | ICD-10-CM | POA: Diagnosis not present

## 2018-07-21 DIAGNOSIS — M159 Polyosteoarthritis, unspecified: Secondary | ICD-10-CM | POA: Diagnosis not present

## 2018-07-21 DIAGNOSIS — I10 Essential (primary) hypertension: Secondary | ICD-10-CM | POA: Diagnosis not present

## 2018-07-23 ENCOUNTER — Telehealth (INDEPENDENT_AMBULATORY_CARE_PROVIDER_SITE_OTHER): Payer: Medicare HMO | Admitting: Cardiology

## 2018-07-23 ENCOUNTER — Encounter: Payer: Self-pay | Admitting: Cardiology

## 2018-07-23 VITALS — BP 115/85 | HR 70 | Wt 224.0 lb

## 2018-07-23 DIAGNOSIS — R0789 Other chest pain: Secondary | ICD-10-CM

## 2018-07-23 DIAGNOSIS — I10 Essential (primary) hypertension: Secondary | ICD-10-CM

## 2018-07-23 DIAGNOSIS — I493 Ventricular premature depolarization: Secondary | ICD-10-CM

## 2018-07-23 DIAGNOSIS — R600 Localized edema: Secondary | ICD-10-CM

## 2018-07-23 NOTE — Progress Notes (Signed)
Virtual Visit via Video Note   This visit type was conducted due to national recommendations for restrictions regarding the COVID-19 Pandemic (e.g. social distancing) in an effort to limit this patient's exposure and mitigate transmission in our community.  Due to her co-morbid illnesses, this patient is at least at moderate risk for complications without adequate follow up.  This format is felt to be most appropriate for this patient at this time.  All issues noted in this document were discussed and addressed.  A limited physical exam was performed with this format.  Please refer to the patient's chart for her consent to telehealth for [facility].   Date:  07/23/2018   ID:  Kristin Peters, DOB 04/13/1950, MRN [id number]  Patient Location: Home Provider Location: Office  PCP:  Glenda Chroman, MD  Cardiologist:  Dr Carlyle Dolly MD Electrophysiologist:  None   Evaluation Performed:  Follow-Up Visit  Chief Complaint:  1 year follow up  History of Present Illness:    Kristin Peters is a 68 y.o. female seen today for follow up of the following medical problems.    1. Chest pain - admission to [facility] 07/2015 with chest pain. Negative workup for ACS.  - 07/2015 echo [facility]: LVEF 55-60%, mild LVH, abnormal diastolic function,  -[id number] overall low risk, no significant ischemia.    - no recent chest pain.     2. HTN - lisionpril caused cough, changed to losartan  - home bp's typically around 110s/80s  3. PVCs - noted on previous EKGs.  - holter with 9% PVCs - no evidence of structural heart disease by echo or nuclear stress test.   - denies any palpitations   4. LE edema - left sided, prior fracture in the past on left.  - edema now bilateral, has worsened over time.    5. Preoperative evaluation - upcoming toe surgery with podiatry   The patient does not have symptoms concerning for COVID-19 infection (fever, chills, cough,  or new shortness of breath).    Past Medical History:  Diagnosis Date  . Abnormal mammogram of right breast 08/30/2017  . Anxiety   . Arthritis    right knee- osteo  . COPD (chronic obstructive pulmonary disease) (HCC)    mild   . Depression   . Dyslipidemia   . GERD (gastroesophageal reflux disease)   . Hypertension   . Osteopenia   . PONV (postoperative nausea and vomiting)    Past Surgical History:  Procedure Laterality Date  . BREAST LUMPECTOMY WITH RADIOACTIVE SEED LOCALIZATION Right 08/30/2017   Procedure: BREAST LUMPECTOMY WITH RADIOACTIVE SEED LOCALIZATION;  Surgeon: Fanny Skates, MD;  Location: [facility];  Service: General;  Laterality: Right;  . GALLBLADDER SURGERY  2000  . left toe surgery x 3     hammer toe 01-14-08 & 01-06-10, remove screw 09-08-12, broke both sides of that ankle 08-14-14  . TUBAL LIGATION  1984     No outpatient medications have been marked as taking for the 07/23/18 encounter (Appointment) with Arnoldo Lenis, MD.     Allergies:   Codeine, Lisinopril, and Adhesive [tape]   Social History   Tobacco Use  . Smoking status: Never Smoker  . Smokeless tobacco: Never Used  Substance Use Topics  . Alcohol use: Yes    Comment: Drinks rarely   . Drug use: Never     Family Hx: The patient's family history includes COPD in her mother.  ROS:  Please see the history of present illness.     All other systems reviewed and are negative.   Prior CV studies:   The following studies were reviewed today:  08/2015 Holter  Rhythm throughout study is sinus rhythm  Min HR 46, max HR 92, Avg HR 62. The low heart rate of 46 occurred at 3AM presumably while patient sleeping  There is rare supraventicular ectopy  There is occasional ventricular ectopy (7393 beats, 9%) in the form of isolated PVCs and bigeminy. No nonsustained or sustained ventricuarl arrhythmias  No diary submitted   08/2015 Nuclear stress  ST segment  depression was noted during stress in the II, III, aVF, V5 and V6 leads. Nonspecific TW change in V3-V4.  The study is normal. Small mild basal septal defect likely related to adjacent gut radiotracer uptake, small area of ischemia less likely. Either findings supports low risk.  The left ventricular ejection fraction is normal (55-65%).  This is a low risk study.  04/2016 ABIs R 1.17 Left 1.16  Labs/Other Tests and Data Reviewed:    EKG:  No ECG reviewed.  Recent Labs: 08/22/2017: ALT 18; BUN 8; Creatinine, Ser 0.94; Hemoglobin 12.9; Platelets 179; Potassium 4.6; Sodium 138   Recent Lipid Panel No results found for: CHOL, TRIG, HDL, CHOLHDL, LDLCALC, LDLDIRECT  Wt Readings from Last 3 Encounters:  08/30/17 221 lb 6.4 oz (100.4 kg)  07/10/17 224 lb 6.4 oz (101.8 kg)  04/30/16 224 lb 9.6 oz (101.9 kg)     Objective:    Vital Signs:   Today's Vitals   07/23/18 0905  BP: 115/85  Pulse: 70  Weight: 224 lb (101.6 kg)   Body mass index is 39.68 kg/m.  Well nourished female sitting comfortably in no distress. Normal affect. Normal speech pattern and tone. No visual or audible signs of SOB or wheezing.  ASSESSMENT & PLAN:     1. Chest pain - prior lexiscan overall low risk -no recurrent symptoms, continue to monitor.    2. PVCs - mild to moderate PVC burden by recent monitor. Asymptomatic. No significant structural heart disase by nuclear stress or echo - we will continue to monitor at this time.   3. HTN -at goal. Unclear if norvac is playing a role in her worsening LE edema - after her upcoming surgery would stop norvasc, start chlorthalidone 12.5mg  daily with BMET/Mg in 2 weeks  4. LE edema - likely multifactorial including some diastolic dysfunction, probable venous insufficiency and obesity - will try stopping norvasc after her upcoming surgery and starting chlorthalidoen as diuretic - check BMET/Mg/TSH 2 weeks after med change  5. Preoperative evaluation -  recommend proceeding with podiatry surgery as planned  COVID-19 Education: The signs and symptoms of COVID-19 were discussed with the patient and how to seek care for testing (follow up with PCP or arrange E-visit).  The importance of social distancing was discussed today.  Time:   Today, I have spent 20 minutes with the patient with telehealth technology discussing the above problems.     Medication Adjustments/Labs and Tests Ordered: Current medicines are reviewed at length with the patient today.  Concerns regarding medicines are outlined above.   Tests Ordered: No orders of the defined types were placed in this encounter.   Medication Changes: No orders of the defined types were placed in this encounter.   Follow Up:  In Person in 4 month(s)  Signed, Carlyle Dolly, MD  07/23/2018 8:24 AM    [facility]

## 2018-07-23 NOTE — Patient Instructions (Signed)
Your physician recommends that you schedule a follow-up appointment in: [place]  Your physician recommends that you continue on your current medications as directed. Please refer to the Current Medication list given to you today.  PLEASE CALL us AFTER YOUR SURGERY   Thank you for choosing [facility]!!

## 2018-07-31 DIAGNOSIS — M2032 Hallux varus (acquired), left foot: Secondary | ICD-10-CM | POA: Diagnosis not present

## 2018-07-31 DIAGNOSIS — Z299 Encounter for prophylactic measures, unspecified: Secondary | ICD-10-CM | POA: Diagnosis not present

## 2018-07-31 DIAGNOSIS — R69 Illness, unspecified: Secondary | ICD-10-CM | POA: Diagnosis not present

## 2018-07-31 DIAGNOSIS — Z6837 Body mass index (BMI) 37.0-37.9, adult: Secondary | ICD-10-CM | POA: Diagnosis not present

## 2018-07-31 DIAGNOSIS — M461 Sacroiliitis, not elsewhere classified: Secondary | ICD-10-CM | POA: Diagnosis not present

## 2018-07-31 DIAGNOSIS — Z1159 Encounter for screening for other viral diseases: Secondary | ICD-10-CM | POA: Diagnosis not present

## 2018-07-31 DIAGNOSIS — E785 Hyperlipidemia, unspecified: Secondary | ICD-10-CM | POA: Diagnosis not present

## 2018-07-31 DIAGNOSIS — I10 Essential (primary) hypertension: Secondary | ICD-10-CM | POA: Diagnosis not present

## 2018-07-31 DIAGNOSIS — Z01812 Encounter for preprocedural laboratory examination: Secondary | ICD-10-CM | POA: Diagnosis not present

## 2018-08-04 DIAGNOSIS — G4733 Obstructive sleep apnea (adult) (pediatric): Secondary | ICD-10-CM | POA: Diagnosis not present

## 2018-08-04 DIAGNOSIS — E785 Hyperlipidemia, unspecified: Secondary | ICD-10-CM | POA: Diagnosis not present

## 2018-08-04 DIAGNOSIS — R69 Illness, unspecified: Secondary | ICD-10-CM | POA: Diagnosis not present

## 2018-08-04 DIAGNOSIS — I10 Essential (primary) hypertension: Secondary | ICD-10-CM | POA: Diagnosis not present

## 2018-08-04 DIAGNOSIS — J449 Chronic obstructive pulmonary disease, unspecified: Secondary | ICD-10-CM | POA: Diagnosis not present

## 2018-08-04 DIAGNOSIS — Z79899 Other long term (current) drug therapy: Secondary | ICD-10-CM | POA: Diagnosis not present

## 2018-08-04 DIAGNOSIS — M2032 Hallux varus (acquired), left foot: Secondary | ICD-10-CM | POA: Diagnosis not present

## 2018-08-04 DIAGNOSIS — K219 Gastro-esophageal reflux disease without esophagitis: Secondary | ICD-10-CM | POA: Diagnosis not present

## 2018-08-06 DIAGNOSIS — M2032 Hallux varus (acquired), left foot: Secondary | ICD-10-CM | POA: Diagnosis not present

## 2018-08-19 DIAGNOSIS — M159 Polyosteoarthritis, unspecified: Secondary | ICD-10-CM | POA: Diagnosis not present

## 2018-08-19 DIAGNOSIS — I10 Essential (primary) hypertension: Secondary | ICD-10-CM | POA: Diagnosis not present

## 2018-08-19 DIAGNOSIS — E7800 Pure hypercholesterolemia, unspecified: Secondary | ICD-10-CM | POA: Diagnosis not present

## 2018-08-20 DIAGNOSIS — M2032 Hallux varus (acquired), left foot: Secondary | ICD-10-CM | POA: Diagnosis not present

## 2018-08-20 DIAGNOSIS — Z4889 Encounter for other specified surgical aftercare: Secondary | ICD-10-CM | POA: Diagnosis not present

## 2018-09-10 DIAGNOSIS — Z4789 Encounter for other orthopedic aftercare: Secondary | ICD-10-CM | POA: Diagnosis not present

## 2018-09-17 DIAGNOSIS — Z6837 Body mass index (BMI) 37.0-37.9, adult: Secondary | ICD-10-CM | POA: Diagnosis not present

## 2018-09-17 DIAGNOSIS — M25551 Pain in right hip: Secondary | ICD-10-CM | POA: Diagnosis not present

## 2018-09-17 DIAGNOSIS — Z299 Encounter for prophylactic measures, unspecified: Secondary | ICD-10-CM | POA: Diagnosis not present

## 2018-09-17 DIAGNOSIS — M8580 Other specified disorders of bone density and structure, unspecified site: Secondary | ICD-10-CM | POA: Diagnosis not present

## 2018-09-17 DIAGNOSIS — I10 Essential (primary) hypertension: Secondary | ICD-10-CM | POA: Diagnosis not present

## 2018-09-18 DIAGNOSIS — Z01419 Encounter for gynecological examination (general) (routine) without abnormal findings: Secondary | ICD-10-CM | POA: Diagnosis not present

## 2018-10-14 DIAGNOSIS — I10 Essential (primary) hypertension: Secondary | ICD-10-CM | POA: Diagnosis not present

## 2018-10-14 DIAGNOSIS — M159 Polyosteoarthritis, unspecified: Secondary | ICD-10-CM | POA: Diagnosis not present

## 2018-10-14 DIAGNOSIS — E7800 Pure hypercholesterolemia, unspecified: Secondary | ICD-10-CM | POA: Diagnosis not present

## 2018-10-16 DIAGNOSIS — R69 Illness, unspecified: Secondary | ICD-10-CM | POA: Diagnosis not present

## 2018-10-20 DIAGNOSIS — M1711 Unilateral primary osteoarthritis, right knee: Secondary | ICD-10-CM | POA: Diagnosis not present

## 2018-10-20 DIAGNOSIS — N907 Vulvar cyst: Secondary | ICD-10-CM | POA: Diagnosis not present

## 2018-10-27 DIAGNOSIS — N907 Vulvar cyst: Secondary | ICD-10-CM | POA: Diagnosis not present

## 2018-10-29 DIAGNOSIS — R69 Illness, unspecified: Secondary | ICD-10-CM | POA: Diagnosis not present

## 2018-10-30 DIAGNOSIS — Z299 Encounter for prophylactic measures, unspecified: Secondary | ICD-10-CM | POA: Diagnosis not present

## 2018-10-30 DIAGNOSIS — I10 Essential (primary) hypertension: Secondary | ICD-10-CM | POA: Diagnosis not present

## 2018-10-30 DIAGNOSIS — Z6837 Body mass index (BMI) 37.0-37.9, adult: Secondary | ICD-10-CM | POA: Diagnosis not present

## 2018-10-30 DIAGNOSIS — K580 Irritable bowel syndrome with diarrhea: Secondary | ICD-10-CM | POA: Diagnosis not present

## 2018-10-30 DIAGNOSIS — R69 Illness, unspecified: Secondary | ICD-10-CM | POA: Diagnosis not present

## 2018-11-04 DIAGNOSIS — N907 Vulvar cyst: Secondary | ICD-10-CM | POA: Diagnosis not present

## 2018-11-05 DIAGNOSIS — Z4789 Encounter for other orthopedic aftercare: Secondary | ICD-10-CM | POA: Diagnosis not present

## 2018-11-05 DIAGNOSIS — Z981 Arthrodesis status: Secondary | ICD-10-CM | POA: Diagnosis not present

## 2018-11-06 DIAGNOSIS — R69 Illness, unspecified: Secondary | ICD-10-CM | POA: Diagnosis not present

## 2018-11-11 DIAGNOSIS — E7800 Pure hypercholesterolemia, unspecified: Secondary | ICD-10-CM | POA: Diagnosis not present

## 2018-11-11 DIAGNOSIS — M159 Polyosteoarthritis, unspecified: Secondary | ICD-10-CM | POA: Diagnosis not present

## 2018-11-11 DIAGNOSIS — I10 Essential (primary) hypertension: Secondary | ICD-10-CM | POA: Diagnosis not present

## 2018-11-17 DIAGNOSIS — M1711 Unilateral primary osteoarthritis, right knee: Secondary | ICD-10-CM | POA: Diagnosis not present

## 2018-11-24 DIAGNOSIS — M1711 Unilateral primary osteoarthritis, right knee: Secondary | ICD-10-CM | POA: Diagnosis not present

## 2018-11-28 DIAGNOSIS — H524 Presbyopia: Secondary | ICD-10-CM | POA: Diagnosis not present

## 2018-12-01 DIAGNOSIS — M1711 Unilateral primary osteoarthritis, right knee: Secondary | ICD-10-CM | POA: Diagnosis not present

## 2018-12-30 DIAGNOSIS — Z79899 Other long term (current) drug therapy: Secondary | ICD-10-CM | POA: Diagnosis not present

## 2018-12-30 DIAGNOSIS — I10 Essential (primary) hypertension: Secondary | ICD-10-CM | POA: Diagnosis not present

## 2018-12-30 DIAGNOSIS — Z6837 Body mass index (BMI) 37.0-37.9, adult: Secondary | ICD-10-CM | POA: Diagnosis not present

## 2018-12-30 DIAGNOSIS — Z299 Encounter for prophylactic measures, unspecified: Secondary | ICD-10-CM | POA: Diagnosis not present

## 2018-12-30 DIAGNOSIS — E7800 Pure hypercholesterolemia, unspecified: Secondary | ICD-10-CM | POA: Diagnosis not present

## 2018-12-30 DIAGNOSIS — G629 Polyneuropathy, unspecified: Secondary | ICD-10-CM | POA: Diagnosis not present

## 2019-01-16 DIAGNOSIS — M960 Pseudarthrosis after fusion or arthrodesis: Secondary | ICD-10-CM | POA: Diagnosis not present

## 2019-01-16 DIAGNOSIS — Z4789 Encounter for other orthopedic aftercare: Secondary | ICD-10-CM | POA: Diagnosis not present

## 2019-01-16 DIAGNOSIS — T8484XA Pain due to internal orthopedic prosthetic devices, implants and grafts, initial encounter: Secondary | ICD-10-CM | POA: Diagnosis not present

## 2019-01-26 DIAGNOSIS — Z299 Encounter for prophylactic measures, unspecified: Secondary | ICD-10-CM | POA: Diagnosis not present

## 2019-01-26 DIAGNOSIS — I10 Essential (primary) hypertension: Secondary | ICD-10-CM | POA: Diagnosis not present

## 2019-01-26 DIAGNOSIS — Z6838 Body mass index (BMI) 38.0-38.9, adult: Secondary | ICD-10-CM | POA: Diagnosis not present

## 2019-01-26 DIAGNOSIS — R69 Illness, unspecified: Secondary | ICD-10-CM | POA: Diagnosis not present

## 2019-01-26 DIAGNOSIS — M7071 Other bursitis of hip, right hip: Secondary | ICD-10-CM | POA: Diagnosis not present

## 2019-02-09 DIAGNOSIS — Z1231 Encounter for screening mammogram for malignant neoplasm of breast: Secondary | ICD-10-CM | POA: Diagnosis not present

## 2019-03-17 ENCOUNTER — Encounter: Payer: Self-pay | Admitting: Cardiology

## 2019-03-17 ENCOUNTER — Encounter: Payer: Self-pay | Admitting: *Deleted

## 2019-03-17 ENCOUNTER — Telehealth (INDEPENDENT_AMBULATORY_CARE_PROVIDER_SITE_OTHER): Payer: Medicare HMO | Admitting: Cardiology

## 2019-03-17 VITALS — BP 136/92 | HR 63 | Ht 63.0 in | Wt 220.0 lb

## 2019-03-17 DIAGNOSIS — I493 Ventricular premature depolarization: Secondary | ICD-10-CM

## 2019-03-17 DIAGNOSIS — I10 Essential (primary) hypertension: Secondary | ICD-10-CM

## 2019-03-17 DIAGNOSIS — R0789 Other chest pain: Secondary | ICD-10-CM | POA: Diagnosis not present

## 2019-03-17 NOTE — Patient Instructions (Addendum)
Medication Instructions:  Continue all current medications.  Labwork: none  Testing/Procedures: none  Follow-Up: 6 months   Any Other Special Instructions Will Be Listed Below (If Applicable). Please call the office on Friday to report blood pressure readings.  If you need a refill on your cardiac medications before your next appointment, please call your pharmacy.

## 2019-03-17 NOTE — Progress Notes (Signed)
Virtual Visit via Telephone Note   This visit type was conducted due to national recommendations for restrictions regarding the COVID-19 Pandemic (e.g. social distancing) in an effort to limit this patient's exposure and mitigate transmission in our community.  Due to her co-morbid illnesses, this patient is at least at moderate risk for complications without adequate follow up.  This format is felt to be most appropriate for this patient at this time.  The patient did not have access to video technology/had technical difficulties with video requiring transitioning to audio format only (telephone).  All issues noted in this document were discussed and addressed.  No physical exam could be performed with this format.  Please refer to the patient's chart for her  consent to telehealth for [facility].   Date:  03/17/2019   ID:  Kristin Peters, DOB 01/03/1951, MRN [id number]  Patient Location: Home Provider Location: Office  PCP:  Glenda Chroman, MD  Cardiologist:  Carlyle Dolly, MD  Electrophysiologist:  None   Evaluation Performed:  Follow-Up Visit  Chief Complaint:  Follow up visit  History of Present Illness:    Kristin Peters is a 69 y.o. female seen today for follow up of the following medical problems.    1. Chest pain - admission to [facility] 07/2015 with chest pain. Negative workup for ACS.  - 07/2015 echo [facility]: LVEF 55-60%, mild LVH, abnormal diastolic function,  [id number] overall low risk, no significant ischemia.   - denies any chest pain    2. HTN - lisionpril caused cough, changed to losartan  - home bp's 130s/80-90s. Typically checks early in AM just after meds  3. PVCs - noted on previous EKGs.  - holter with 9% PVCs - no evidence of structural heart disease by echo or nuclear stress test.   - notes some HRs in the 50s at times. No recent symptoms.    4. LE edema - left sided, prior fracture in the past on left.      The patient does not have symptoms concerning for COVID-19 infection (fever, chills, cough, or new shortness of breath).    Past Medical History:  Diagnosis Date  . Abnormal mammogram of right breast 08/30/2017  . Anxiety   . Arthritis    right knee- osteo  . COPD (chronic obstructive pulmonary disease) (HCC)    mild   . Depression   . Dyslipidemia   . GERD (gastroesophageal reflux disease)   . Hypertension   . Osteopenia   . PONV (postoperative nausea and vomiting)    Past Surgical History:  Procedure Laterality Date  . BREAST LUMPECTOMY WITH RADIOACTIVE SEED LOCALIZATION Right 08/30/2017   Procedure: BREAST LUMPECTOMY WITH RADIOACTIVE SEED LOCALIZATION;  Surgeon: Fanny Skates, MD;  Location: [place];  Service: General;  Laterality: Right;  . GALLBLADDER SURGERY  2000  . left toe surgery x 3     hammer toe 01-14-08 & 01-06-10, remove screw 09-08-12, broke both sides of that ankle 08-14-14  . TUBAL LIGATION  1984     No outpatient medications have been marked as taking for the 03/17/19 encounter (Appointment) with Arnoldo Lenis, MD.     Allergies:   Codeine, Lisinopril, and Adhesive [tape]   Social History   Tobacco Use  . Smoking status: Never Smoker  . Smokeless tobacco: Never Used  Substance Use Topics  . Alcohol use: Yes    Comment: Drinks rarely   . Drug use: Never  Family Hx: The patient's family history includes COPD in her mother.  ROS:   Please see the history of present illness.     All other systems reviewed and are negative.   Prior CV studies:   The following studies were reviewed today:  08/2015 Holter  Rhythm throughout study is sinus rhythm  Min HR 46, max HR 92, Avg HR 62. The low heart rate of 46 occurred at 3AM presumably while patient sleeping  There is rare supraventicular ectopy  There is occasional ventricular ectopy (7393 beats, 9%) in the form of isolated PVCs and bigeminy. No nonsustained or  sustained ventricuarl arrhythmias  No diary submitted   08/2015 Nuclear stress  ST segment depression was noted during stress in the II, III, aVF, V5 and V6 leads. Nonspecific TW change in V3-V4.  The study is normal. Small mild basal septal defect likely related to adjacent gut radiotracer uptake, small area of ischemia less likely. Either findings supports low risk.  The left ventricular ejection fraction is normal (55-65%).  This is a low risk study.  04/2016 ABIsR 1.17 Left 1.16  Labs/Other Tests and Data Reviewed:    EKG:  No ECG reviewed.  Recent Labs: No results found for requested labs within last 8760 hours.   Recent Lipid Panel No results found for: CHOL, TRIG, HDL, CHOLHDL, LDLCALC, LDLDIRECT  Wt Readings from Last 3 Encounters:  07/23/18 224 lb (101.6 kg)  08/30/17 221 lb 6.4 oz (100.4 kg)  07/10/17 224 lb 6.4 oz (101.8 kg)     Objective:    Vital Signs:   Today's Vitals   03/17/19 0815  BP: (!) 136/92  Pulse: 63  Weight: 220 lb (99.8 kg)  Height: 5\' 3"  (1.6 m)   Body mass index is 38.97 kg/m. Normal affect, normal speech pattern and tone. Comfortable, no apparent distress. No audible signs of SOB or wheezing.   ASSESSMENT & PLAN:    1. Chest pain -priorlexiscan overall low risk -no recent symptoms, continue to monitor.   2. PVCs - mild to moderate PVC burden by recent monitor. Asymptomatic. No significant structural heart disase by nuclear stress or echo -continue beta blocker. Occasional HRs in 50s, if were to get lower would decrease her lopressor.   3. HTN -mildly elevated though she checks right after taking meds - call Friday to update [place] on her afternoon bp's - may increase norvasc depending on any recent issues with LE edema, if LE edema may change norvasc to chlorthalidone.       COVID-19 Education: The signs and symptoms of COVID-19 were discussed with the patient and how to seek care for testing (follow up with PCP or  arrange E-visit).  The importance of social distancing was discussed today.  Time:   Today, I have spent 22 minutes with the patient with telehealth technology discussing the above problems.     Medication Adjustments/Labs and Tests Ordered: Current medicines are reviewed at length with the patient today.  Concerns regarding medicines are outlined above.   Tests Ordered: No orders of the defined types were placed in this encounter.   Medication Changes: No orders of the defined types were placed in this encounter.   Follow Up:  Either In Person or Virtual in 6 month(s)  Signed, Carlyle Dolly, MD  03/17/2019 8:12 AM    [place]

## 2019-03-25 ENCOUNTER — Telehealth: Payer: Self-pay | Admitting: Cardiology

## 2019-03-25 NOTE — Telephone Encounter (Signed)
Calling with BP readings

## 2019-03-25 NOTE — Telephone Encounter (Signed)
Patient got first does of Covid Shot

## 2019-03-25 NOTE — Telephone Encounter (Signed)
03/19/19 BP 117/85 & HR 63 03/20/19 BP 130/86 & HR 66 03/22/19 BP 129/83 & HR 62 03/23/19 BP 142/88 & HR 65 (reports this was after her dogs were fighting)

## 2019-03-26 NOTE — Telephone Encounter (Signed)
Reported bp's overall look good, no changes   Zandra Abts MD

## 2019-03-26 NOTE — Telephone Encounter (Signed)
Patient informed. 

## 2019-07-20 ENCOUNTER — Other Ambulatory Visit: Payer: Self-pay | Admitting: Sports Medicine

## 2019-07-20 DIAGNOSIS — M545 Low back pain, unspecified: Secondary | ICD-10-CM

## 2019-07-20 DIAGNOSIS — M5416 Radiculopathy, lumbar region: Secondary | ICD-10-CM

## 2019-08-01 ENCOUNTER — Ambulatory Visit
Admission: RE | Admit: 2019-08-01 | Discharge: 2019-08-01 | Disposition: A | Discharge status: Home / Self Care | Payer: Medicare HMO | Source: Ambulatory Visit | Attending: Sports Medicine | Admitting: Sports Medicine

## 2019-08-01 DIAGNOSIS — M5416 Radiculopathy, lumbar region: Secondary | ICD-10-CM

## 2019-08-01 DIAGNOSIS — M545 Low back pain, unspecified: Secondary | ICD-10-CM

## 2019-09-15 ENCOUNTER — Ambulatory Visit: Payer: Medicare HMO | Admitting: Cardiology

## 2019-09-15 ENCOUNTER — Encounter: Payer: Self-pay | Admitting: Cardiology

## 2019-09-15 VITALS — BP 140/80 | HR 62 | Ht 63.0 in | Wt 229.4 lb

## 2019-09-15 DIAGNOSIS — R0789 Other chest pain: Secondary | ICD-10-CM | POA: Diagnosis not present

## 2019-09-15 DIAGNOSIS — I493 Ventricular premature depolarization: Secondary | ICD-10-CM | POA: Diagnosis not present

## 2019-09-15 DIAGNOSIS — I10 Essential (primary) hypertension: Secondary | ICD-10-CM | POA: Diagnosis not present

## 2019-09-15 NOTE — Patient Instructions (Signed)

## 2019-09-15 NOTE — Progress Notes (Signed)
Clinical Summary Kristin Peters is a 69 y.o.female seen today for follow up of the following medical problems.    1. Chest pain - admission to [facility] 07/2015 with chest pain. Negative workup for ACS.  - 07/2015 echo [facility]: LVEF 55-60%, mild LVH, abnormal diastolic function,  -[id number] overall low risk, no significant ischemia.   - denies any chest pain.     2. HTN - lisionpril caused cough, changed to losartan  - compliant with meds - home bp's 120s/80s  3. PVCs - noted on previous EKGs.  - holter with 9% PVCs - no evidence of structural heart disease by echo or nuclear stress test.  - no recent palpitations   4. LE edema - left sided, prior fracture in the past on left.   5. Chronic back pain - followed by ortho  SH: son history of cerebral paulsy, passed away in his early 68s.   Past Medical History:  Diagnosis Date  . Abnormal mammogram of right breast 08/30/2017  . Anxiety   . Arthritis    right knee- osteo  . COPD (chronic obstructive pulmonary disease) (HCC)    mild   . Depression   . Dyslipidemia   . GERD (gastroesophageal reflux disease)   . Hypertension   . Osteopenia   . PONV (postoperative nausea and vomiting)      Allergies  Allergen Reactions  . Codeine     REACTION: itch  . Lisinopril Cough  . Adhesive [Tape] Rash     Current Outpatient Medications  Medication Sig Dispense Refill  . ALPRAZolam (XANAX) 0.5 MG tablet Take 0.5 mg by mouth in the morning, at noon, and at bedtime.     Marland Kitchen amLODipine (NORVASC) 5 MG tablet Take 5 mg by mouth every evening.     Marland Kitchen losartan (COZAAR) 100 MG tablet Take 100 mg by mouth every morning.     . metoprolol tartrate (LOPRESSOR) 25 MG tablet Take 1 tablet (25 mg total) by mouth 2 (two) times daily. 180 tablet 3  . omeprazole (PRILOSEC) 20 MG capsule Take 20 mg by mouth 2 (two) times daily.     . simvastatin (ZOCOR) 20 MG tablet Take 20 mg by mouth every  evening.      No current facility-administered medications for this visit.     Past Surgical History:  Procedure Laterality Date  . BREAST LUMPECTOMY WITH RADIOACTIVE SEED LOCALIZATION Right 08/30/2017   Procedure: BREAST LUMPECTOMY WITH RADIOACTIVE SEED LOCALIZATION;  Surgeon: Fanny Skates, MD;  Location: [place];  Service: General;  Laterality: Right;  . GALLBLADDER SURGERY  2000  . left toe surgery x 3     hammer toe 01-14-08 & 01-06-10, remove screw 09-08-12, broke both sides of that ankle 08-14-14  . TUBAL LIGATION  1984     Allergies  Allergen Reactions  . Codeine     REACTION: itch  . Lisinopril Cough  . Adhesive [Tape] Rash      Family History  Problem Relation Age of Onset  . COPD Mother      Social History Kristin Peters reports that she has never smoked. She has never used smokeless tobacco. Kristin Peters reports current alcohol use.   Review of Systems CONSTITUTIONAL: No weight loss, fever, chills, weakness or fatigue.  HEENT: Eyes: No visual loss, blurred vision, double vision or yellow sclerae.No hearing loss, sneezing, congestion, runny nose or sore throat.  SKIN: No rash or itching.  CARDIOVASCULAR: per hpi RESPIRATORY:  No shortness of breath, cough or sputum.  GASTROINTESTINAL: No anorexia, nausea, vomiting or diarrhea. No abdominal pain or blood.  GENITOURINARY: No burning on urination, no polyuria NEUROLOGICAL: No headache, dizziness, syncope, paralysis, ataxia, numbness or tingling in the extremities. No change in bowel or bladder control.  MUSCULOSKELETAL: No muscle, back pain, joint pain or stiffness.  LYMPHATICS: No enlarged nodes. No history of splenectomy.  PSYCHIATRIC: No history of depression or anxiety.  ENDOCRINOLOGIC: No reports of sweating, cold or heat intolerance. No polyuria or polydipsia.  Marland Kitchen   Physical Examination Today's Vitals   09/15/19 1400  BP: 140/80  Pulse: 62  SpO2: 96%  Weight: 229 lb 6.4 oz (104.1 kg)    Height: 5\' 3"  (1.6 m)   Body mass index is 40.64 kg/m.  Gen: resting comfortably, no acute distress HEENT: no scleral icterus, pupils equal round and reactive, no palptable cervical adenopathy,  CV: RRR, no m/r/g, no jvd Resp: Clear to auscultation bilaterally GI: abdomen is soft, non-tender, non-distended, normal bowel sounds, no hepatosplenomegaly MSK: extremities are warm, no edema.  Skin: warm, no rash Neuro:  no focal deficits Psych: appropriate affect   Diagnostic Studies  08/2015 Holter  Rhythm throughout study is sinus rhythm  Min HR 46, max HR 92, Avg HR 62. The low heart rate of 46 occurred at 3AM presumably while patient sleeping  There is rare supraventicular ectopy  There is occasional ventricular ectopy (7393 beats, 9%) in the form of isolated PVCs and bigeminy. No nonsustained or sustained ventricuarl arrhythmias  No diary submitted   08/2015 Nuclear stress  ST segment depression was noted during stress in the II, III, aVF, V5 and V6 leads. Nonspecific TW change in V3-V4.  The study is normal. Small mild basal septal defect likely related to adjacent gut radiotracer uptake, small area of ischemia less likely. Either findings supports low risk.  The left ventricular ejection fraction is normal (55-65%).  This is a low risk study.  04/2016 ABIsR 1.17 Left 1.16   Assessment and Plan  1. Chest pain -priorlexiscan overall low risk -no recent chest pains, continue to monitor.   2. PVCs - mild to moderate PVC burden by recent monitor. Asymptomatic. No significant structural heart disase by nuclear stress or echo - no symptoms, continue current meds  - EKG shows SR, no PVCs  3. HTN -at goal based on home numbers, continue current meds     Arnoldo Lenis, M.D.

## 2019-09-30 ENCOUNTER — Telehealth: Payer: Self-pay | Admitting: Cardiology

## 2019-09-30 NOTE — Telephone Encounter (Signed)
Reports that she was told at her last visit that another acid reflux medication could be sent to pharmacy since omeprazole wasn't working well anymore. Says she was advised to call office if she wanted to try something different. Advised that message would be sen to provider and it may be next week before she get a response. Advised she may want to contact her PCP for this request as well. Verbalized understanding.

## 2019-09-30 NOTE — Telephone Encounter (Signed)
Pt called stating that at her last apt Dr. Harl Bowie told her he would write her a Rx for acid reflux medication -- please send to [place]

## 2019-10-01 MED ORDER — PANTOPRAZOLE SODIUM 40 MG PO TBEC: 40.0000 mg | DELAYED_RELEASE_TABLET | Freq: Every day | ORAL | 6 refills | Status: DC

## 2019-10-01 NOTE — Telephone Encounter (Signed)
Can stop omeprazole, start protonix 40mg  daily  Zandra Abts MD

## 2019-10-01 NOTE — Telephone Encounter (Signed)
Patient informed and verbalized understanding of plan. 

## 2020-05-13 ENCOUNTER — Other Ambulatory Visit: Payer: Self-pay | Admitting: Physical Medicine and Rehabilitation

## 2020-05-13 ENCOUNTER — Other Ambulatory Visit: Payer: Medicare HMO | Admitting: Physical Medicine and Rehabilitation

## 2020-05-13 DIAGNOSIS — M79604 Pain in right leg: Secondary | ICD-10-CM

## 2020-05-21 ENCOUNTER — Ambulatory Visit
Admission: RE | Admit: 2020-05-21 | Discharge: 2020-05-21 | Disposition: A | Discharge status: Home / Self Care | Payer: Medicare HMO | Source: Ambulatory Visit | Attending: Physical Medicine and Rehabilitation | Admitting: Physical Medicine and Rehabilitation

## 2020-05-21 DIAGNOSIS — M79604 Pain in right leg: Secondary | ICD-10-CM

## 2020-05-21 DIAGNOSIS — M545 Low back pain, unspecified: Secondary | ICD-10-CM

## 2020-05-28 ENCOUNTER — Other Ambulatory Visit: Payer: Medicare HMO

## 2020-12-14 ENCOUNTER — Ambulatory Visit: Payer: Medicare HMO | Admitting: Cardiology

## 2020-12-14 ENCOUNTER — Encounter: Payer: Self-pay | Admitting: Cardiology

## 2020-12-14 VITALS — BP 134/82 | HR 62 | Ht 63.0 in | Wt 238.6 lb

## 2020-12-14 DIAGNOSIS — R600 Localized edema: Secondary | ICD-10-CM | POA: Diagnosis not present

## 2020-12-14 DIAGNOSIS — R0602 Shortness of breath: Secondary | ICD-10-CM

## 2020-12-14 DIAGNOSIS — R0789 Other chest pain: Secondary | ICD-10-CM

## 2020-12-14 MED ORDER — FUROSEMIDE 20 MG PO TABS: 20.0000 mg | ORAL_TABLET | ORAL | 2 refills | Status: AC | PRN

## 2020-12-14 NOTE — Patient Instructions (Signed)
Medication Instructions:  Begin Lasix 20mg  as needed for swelling  Continue all other medications.  Labwork: none  Testing/Procedures: Your physician has requested that you have an echocardiogram. Echocardiography is a painless test that uses sound waves to create images of your heart. It provides your doctor with information about the size and shape of your heart and how well your heart's chambers and valves are working. This procedure takes approximately one hour. There are no restrictions for this procedure. Office will contact with results via phone or letter.     Follow-Up: 3 months   Any Other Special Instructions Will Be Listed Below (If Applicable).   If you need a refill on your cardiac medications before your next appointment, please call your pharmacy.

## 2020-12-14 NOTE — Progress Notes (Signed)
Clinical Summary Kristin Peters is a 70 y.o.female seen today for follow up of the following medical problems.      1. Chest pain - admission to [facility] 07/2015 with chest pain. Negative workup for ACS.  - 07/2015 echo [facility]: LVEF 55-60%, mild LVH, abnormal diastolic function,   - 08/9371 lexiscan overall low risk, no significant ischemia.         - some recent chest pain - under left breast. Somewhat sharp pain, 3/10 in severity. Can occur at rest or with exertion. No other assocaited symptoms. Not positional. Better with pressing on it. Lasts a few seconds. No relation to food or eating. Occurs 2-3 times per months - sedentary lifestyle due to back pain. DOE with walking [employer]  CAD risk factors: HTN, hyperlipidemia    2. LE edema - recent LE edema, bilateral. Long history of left leg swelling from prior surgeries but bialteral swelling is new - some recent DOE - no orthopnea, no PND     3. HTN - lisionpril caused cough, changed to losartan    4. PVCs - noted on previous EKGs.  - holter with 9% PVCs - no evidence of structural heart disease by echo or nuclear stress test.       5. Chronic back pain - followed by ortho   SH: son history of cerebral paulsy, passed away in his early 84s.    Past Medical History:  Diagnosis Date   Abnormal mammogram of right breast 08/30/2017   Anxiety    Arthritis    right knee- osteo   COPD (chronic obstructive pulmonary disease) (HCC)    mild    Depression    Dyslipidemia    GERD (gastroesophageal reflux disease)    Hypertension    Osteopenia    PONV (postoperative nausea and vomiting)      Allergies  Allergen Reactions   Codeine     REACTION: itch   Lisinopril Cough   Adhesive [Tape] Rash     Current Outpatient Medications  Medication Sig Dispense Refill   ALPRAZolam (XANAX) 0.5 MG tablet Take 0.5 mg by mouth in the morning, at noon, and at bedtime.      amLODipine (NORVASC) 5 MG tablet  Take 5 mg by mouth every evening.      losartan (COZAAR) 100 MG tablet Take 100 mg by mouth every morning.      metoprolol tartrate (LOPRESSOR) 25 MG tablet Take 1 tablet (25 mg total) by mouth 2 (two) times daily. 180 tablet 3   pantoprazole (PROTONIX) 40 MG tablet Take 1 tablet (40 mg total) by mouth daily. 30 tablet 6   simvastatin (ZOCOR) 20 MG tablet Take 20 mg by mouth every evening.      No current facility-administered medications for this visit.     Past Surgical History:  Procedure Laterality Date   BREAST LUMPECTOMY WITH RADIOACTIVE SEED LOCALIZATION Right 08/30/2017   Procedure: BREAST LUMPECTOMY WITH RADIOACTIVE SEED LOCALIZATION;  Surgeon: Fanny Skates, MD;  Location: [place];  Service: General;  Laterality: Right;   GALLBLADDER SURGERY  2000   left toe surgery x 3     hammer toe 01-14-08 & 01-06-10, remove screw 09-08-12, broke both sides of that ankle 08-14-14   TUBAL LIGATION  1984     Allergies  Allergen Reactions   Codeine     REACTION: itch   Lisinopril Cough   Adhesive [Tape] Rash      Family History  Problem Relation Age of Onset   COPD Mother      Social History Kristin Peters reports that she has never smoked. She has never used smokeless tobacco. Kristin Peters reports current alcohol use.   Review of Systems CONSTITUTIONAL: No weight loss, fever, chills, weakness or fatigue.  HEENT: Eyes: No visual loss, blurred vision, double vision or yellow sclerae.No hearing loss, sneezing, congestion, runny nose or sore throat.  SKIN: No rash or itching.  CARDIOVASCULAR: per hpi RESPIRATORY: No shortness of breath, cough or sputum.  GASTROINTESTINAL: No anorexia, nausea, vomiting or diarrhea. No abdominal pain or blood.  GENITOURINARY: No burning on urination, no polyuria NEUROLOGICAL: No headache, dizziness, syncope, paralysis, ataxia, numbness or tingling in the extremities. No change in bowel or bladder control.  MUSCULOSKELETAL: No muscle,  back pain, joint pain or stiffness.  LYMPHATICS: No enlarged nodes. No history of splenectomy.  PSYCHIATRIC: No history of depression or anxiety.  ENDOCRINOLOGIC: No reports of sweating, cold or heat intolerance. No polyuria or polydipsia.  Marland Kitchen   Physical Examination Today's Vitals   12/14/20 1011  BP: 134/82  Pulse: 62  SpO2: 98%  Weight: 238 lb 9.6 oz (108.2 kg)  Height: 5\' 3"  (1.6 m)   Body mass index is 42.27 kg/m.  Gen: resting comfortably, no acute distress HEENT: no scleral icterus, pupils equal round and reactive, no palptable cervical adenopathy,  CV: RRR, no m/r/g no jvd Resp: Clear to auscultation bilaterally GI: abdomen is soft, non-tender, non-distended, normal bowel sounds, no hepatosplenomegaly MSK: extremities are warm, 1+ bilateral LE edema Skin: warm, no rash Neuro:  no focal deficits Psych: appropriate affect   Diagnostic Studies  08/2015 Holter Rhythm throughout study is sinus rhythm Min HR 46, max HR 92, Avg HR 62. The low heart rate of 46 occurred at 3AM presumably while patient sleeping There is rare supraventicular ectopy There is occasional ventricular ectopy (7393 beats, 9%) in the form of isolated PVCs and bigeminy. No nonsustained or sustained ventricuarl arrhythmias No diary submitted     08/2015 Nuclear stress ST segment depression was noted during stress in the II, III, aVF, V5 and V6 leads. Nonspecific TW change in V3-V4. The study is normal. Small mild basal septal defect likely related to adjacent gut radiotracer uptake, small area of ischemia less likely. Either findings supports low risk. The left ventricular ejection fraction is normal (55-65%). This is a low risk study.   04/2016 ABIs R 1.17 Left 1.16   Assessment and Plan  1. Chest pain - recent chest pain along with recent DOE - will obtain echo initially, based on results likely pursue ischemic testing. Cannot exercise due to chronic back pain. Given body habitus I think coronary  CTA would be best option, likely order pending echo - EKG today shows SR, chronic anterior ST/T changes   2. DOE/LE edema - order echo to look for cardiac dysfunction - start lasix 20mg  prn.      Arnoldo Lenis, M.D.

## 2020-12-30 ENCOUNTER — Other Ambulatory Visit: Payer: Self-pay | Admitting: Internal Medicine

## 2020-12-30 DIAGNOSIS — Z139 Encounter for screening, unspecified: Secondary | ICD-10-CM

## 2021-01-26 ENCOUNTER — Other Ambulatory Visit: Payer: Self-pay

## 2021-01-26 ENCOUNTER — Ambulatory Visit (HOSPITAL_COMMUNITY)
Admission: RE | Admit: 2021-01-26 | Discharge: 2021-01-26 | Disposition: A | Discharge status: Home / Self Care | Payer: Medicare HMO | Source: Ambulatory Visit | Attending: Cardiology | Admitting: Cardiology

## 2021-01-26 DIAGNOSIS — R0602 Shortness of breath: Secondary | ICD-10-CM | POA: Insufficient documentation

## 2021-01-26 LAB — ECHOCARDIOGRAM COMPLETE
Area-P 1/2: 4.06 cm2
S' Lateral: 2.6 cm

## 2021-01-26 MED ORDER — PERFLUTREN LIPID MICROSPHERE
1.0000 mL | INTRAVENOUS | Status: AC | PRN
  Administered 2021-01-26: 09:00:00 3 mL via INTRAVENOUS
  Filled 2021-01-26: qty 10

## 2021-01-26 NOTE — Progress Notes (Signed)
*  PRELIMINARY RESULTS* Echocardiogram 2D Echocardiogram has been performed with [facility].  Kristin Peters 01/26/2021, 9:36 AM

## 2021-02-22 ENCOUNTER — Telehealth: Payer: Self-pay | Admitting: *Deleted

## 2021-02-22 NOTE — Telephone Encounter (Signed)
-----   Message from Arnoldo Lenis, MD sent at 02/22/2021  2:55 PM EST ----- Normal echo, heart function looks good  Zandra Abts MD

## 2021-02-22 NOTE — Telephone Encounter (Signed)
Patient informed. Copy sent to PCP °

## 2021-03-22 ENCOUNTER — Ambulatory Visit: Payer: Medicare HMO | Admitting: Cardiology

## 2021-05-26 ENCOUNTER — Ambulatory Visit: Payer: Medicare HMO | Admitting: Cardiology

## 2021-05-26 ENCOUNTER — Encounter: Payer: Self-pay | Admitting: Cardiology

## 2021-05-26 VITALS — BP 146/90 | HR 64 | Ht 63.0 in | Wt 227.8 lb

## 2021-05-26 DIAGNOSIS — R0602 Shortness of breath: Secondary | ICD-10-CM | POA: Diagnosis not present

## 2021-05-26 DIAGNOSIS — I10 Essential (primary) hypertension: Secondary | ICD-10-CM

## 2021-05-26 DIAGNOSIS — R0789 Other chest pain: Secondary | ICD-10-CM

## 2021-05-26 MED ORDER — SPIRONOLACTONE 25 MG PO TABS: 25.0000 mg | ORAL_TABLET | Freq: Every day | ORAL | 6 refills | Status: DC

## 2021-05-26 NOTE — Progress Notes (Signed)
? ? ? ?Clinical Summary ?Kristin Peters is a 71 y.o.femaleseen today for follow up of the following medical problems.  ?  ?  ?1. Chest pain ?- admission to [facility] 07/2015 with chest pain. Negative workup for ACS.  ?- 07/2015 echo [facility] Internal Med: LVEF 55-60%, mild LVH, abnormal diastolic function,  ? - 0/6237 lexiscan overall low risk, no significant ischemia.  ?  ?  ?  ?  ?-prior visit reported chest pain ?- under left breast. Somewhat sharp pain, 3/10 in severity. Can occur at rest or with exertion. No other assocaited symptoms. Not positional. Better with pressing on it. Lasts a few seconds. No relation to food or eating. Occurs 2-3 times per months ?- sedentary lifestyle due to back pain. DOE with walking [employer] ? CAD risk factors: HTN, hyperlipidemia ?  ?  ?12/2020 echo LVEF 60-65%, no WMAs ?- infrequent chest pains since last visit ? ? ? ? ?  ?2. LE edema ?- recent LE edema, bilateral. Long history of left leg swelling from prior surgeries but bialteral swelling is new ?- recent benign echo ?- taking lasix prn ?- has been on norvasc for bp ?  ?  ?  ?3. HTN ?- lisionpril caused cough, changed to losartan ?- frequent urination, avoiding daily scheduled diuretic ?  ?4. PVCs ?- noted on previous EKGs.  ?- holter with 9% PVCs ?- no evidence of structural heart disease by echo or nuclear stress test.  ?  ?  ?  ?5. Chronic back pain ?- followed by ortho ?  ?SH: son history of cerebral paulsy, passed away in his early 66s.  ? ? ?Past Medical History:  ?Diagnosis Date  ? Abnormal mammogram of right breast 08/30/2017  ? Anxiety   ? Arthritis   ? right knee- osteo  ? COPD (chronic obstructive pulmonary disease) ([place])   ? mild   ? Depression   ? Dyslipidemia   ? GERD (gastroesophageal reflux disease)   ? Hypertension   ? Osteopenia   ? PONV (postoperative nausea and vomiting)   ? ? ? ?Allergies  ?Allergen Reactions  ? Codeine   ?  REACTION: itch  ? Lisinopril Cough  ? Adhesive [Tape] Rash  ? ? ? ?Current Outpatient  Medications  ?Medication Sig Dispense Refill  ? ALPRAZolam (XANAX) 0.5 MG tablet Take 0.5 mg by mouth in the morning, at noon, and at bedtime.     ? amLODipine (NORVASC) 5 MG tablet Take 5 mg by mouth every evening.     ? furosemide (LASIX) 20 MG tablet Take 1 tablet (20 mg total) by mouth as needed for edema (swelling). 30 tablet 2  ? gabapentin (NEURONTIN) 300 MG capsule Take 300 mg by mouth 3 (three) times daily as needed.    ? losartan (COZAAR) 100 MG tablet Take 100 mg by mouth every morning.     ? metoprolol tartrate (LOPRESSOR) 25 MG tablet Take 25 mg by mouth 2 (two) times daily.    ? omeprazole (PRILOSEC) 20 MG capsule Take 20 mg by mouth daily.    ? simvastatin (ZOCOR) 20 MG tablet Take 20 mg by mouth every evening.     ? ?No current facility-administered medications for this visit.  ? ? ? ?Past Surgical History:  ?Procedure Laterality Date  ? BREAST LUMPECTOMY WITH RADIOACTIVE SEED LOCALIZATION Right 08/30/2017  ? Procedure: BREAST LUMPECTOMY WITH RADIOACTIVE SEED LOCALIZATION;  Surgeon: Fanny Skates, MD;  Location: [facility];  Service: General;  Laterality: Right;  ?  GALLBLADDER SURGERY  2000  ? left toe surgery x 3    ? hammer toe 01-14-08 & 01-06-10, remove screw 09-08-12, broke both sides of that ankle 08-14-14  ? TUBAL LIGATION  1984  ? ? ? ?Allergies  ?Allergen Reactions  ? Codeine   ?  REACTION: itch  ? Lisinopril Cough  ? Adhesive [Tape] Rash  ? ? ? ? ?Family History  ?Problem Relation Age of Onset  ? COPD Mother   ? ? ? ?Social History ?Kristin Peters reports that she has never smoked. She has never used smokeless tobacco. ?Kristin Peters reports current alcohol use. ? ? ?Review of Systems ?CONSTITUTIONAL: No weight loss, fever, chills, weakness or fatigue.  ?HEENT: Eyes: No visual loss, blurred vision, double vision or yellow sclerae.No hearing loss, sneezing, congestion, runny nose or sore throat.  ?SKIN: No rash or itching.  ?CARDIOVASCULAR: per hpi ?RESPIRATORY: No shortness of breath,  cough or sputum.  ?GASTROINTESTINAL: No anorexia, nausea, vomiting or diarrhea. No abdominal pain or blood.  ?GENITOURINARY: No burning on urination, no polyuria ?NEUROLOGICAL: No headache, dizziness, syncope, paralysis, ataxia, numbness or tingling in the extremities. No change in bowel or bladder control.  ?MUSCULOSKELETAL: No muscle, back pain, joint pain or stiffness.  ?LYMPHATICS: No enlarged nodes. No history of splenectomy.  ?PSYCHIATRIC: No history of depression or anxiety.  ?ENDOCRINOLOGIC: No reports of sweating, cold or heat intolerance. No polyuria or polydipsia.  ?. ? ? ?Physical Examination ?Today's Vitals  ? 05/26/21 1020  ?BP: (!) 146/90  ?Pulse: 64  ?SpO2: 97%  ?Weight: 227 lb 12.8 oz (103.3 kg)  ?Height: '5\' 3"'$  (1.6 m)  ? ?Body mass index is 40.35 kg/m?. ? ?Gen: resting comfortably, no acute distress ?HEENT: no scleral icterus, pupils equal round and reactive, no palptable cervical adenopathy,  ?CV: RRR, no m/r/g no jvd ?Resp: Clear to auscultation bilaterally ?GI: abdomen is soft, non-tender, non-distended, normal bowel sounds, no hepatosplenomegaly ?MSK: extremities are warm, no edema.  ?Skin: warm, no rash ?Neuro:  no focal deficits ?Psych: appropriate affect ? ? ?Diagnostic Studies ? ? ?08/2015 Holter ?Rhythm throughout study is sinus rhythm ?Min HR 46, max HR 92, Avg HR 62. The low heart rate of 46 occurred at 3AM presumably while patient sleeping ?There is rare supraventicular ectopy ?There is occasional ventricular ectopy (7393 beats, 9%) in the form of isolated PVCs and bigeminy. No nonsustained or sustained ventricuarl arrhythmias ?No diary submitted ?  ?  ?08/2015 Nuclear stress ?ST segment depression was noted during stress in the II, III, aVF, V5 and V6 leads. Nonspecific TW change in V3-V4. ?The study is normal. Small mild basal septal defect likely related to adjacent gut radiotracer uptake, small area of ischemia less likely. Either findings supports low risk. ?The left ventricular  ejection fraction is normal (55-65%). ?This is a low risk study. ?  ?04/2016 ABIs R 1.17 Left 1.16 ?  ? 12/2020 echo ?1. Left ventricular ejection fraction, by estimation, is 60 to 65%. The  ?left ventricle has normal function. The left ventricle has no regional  ?wall motion abnormalities. Left ventricular diastolic parameters were  ?normal.  ? 2. Right ventricular systolic function is normal. The right ventricular  ?size is normal.  ? 3. The mitral valve is abnormal. Trivial mitral valve regurgitation. No  ?evidence of mitral stenosis.  ? 4. The aortic valve was not well visualized. There is moderate  ?calcification of the aortic valve. There is moderate thickening of the  ?aortic valve. Aortic valve regurgitation is not  visualized. Aortic valve  ?sclerosis/calcification is present, without  ?any evidence of aortic stenosis.  ? 5. The inferior vena cava is normal in size with greater than 50%  ?respiratory variability, suggesting right atrial pressure of 3 mmHg.  ? ?Assessment and Plan  ?1. Chest pain ?- prior chest pains have resolve,were somewhat atypical ?- echo was benign ?- no plans for ischemic testing at this time, continue to monitr ?  ?2. DOE/LE edema ?- recent echo was benign ?- continue prn lasix ?- with LE edema will try stopping norvasc ? ?3. HTN ?- change norvasc to aldactone, monitor if any improvement in leg edema ? ? ?F/u 4 months ? ? ? ? ?Arnoldo Lenis, M.D. ?

## 2021-05-26 NOTE — Patient Instructions (Addendum)
Medication Instructions:  ?Stop Norvasc (Amlodipine) ?Begin Aldactone '25mg'$  daily  ?Continue all other medications.    ? ?Labwork: ?BMET - order given today ?Please do in 2 weeks (around 06/09/21) ?Office will contact with results via phone or letter.    ? ?Testing/Procedures: ?none ? ?Follow-Up: ?4 months  ? ?Any Other Special Instructions Will Be Listed Below (If Applicable). ? ? ?If you need a refill on your cardiac medications before your next appointment, please call your pharmacy. ? ?

## 2021-06-21 ENCOUNTER — Telehealth: Payer: Self-pay | Admitting: Cardiology

## 2021-06-21 NOTE — Telephone Encounter (Signed)
Pt c/o medication issue:  1. Name of Medication: spironolactone (ALDACTONE) 25 MG tablet  2. How are you currently taking this medication (dosage and times per day)? As directed  3. Are you having a reaction (difficulty breathing--STAT)?   4. What is your medication issue? Patient said that the medication change is helping to keep her feet from swelling but her BP is still a little high ( not as low as it was with her amlodipine)   135/85 with her machine 150/87 with a machine from her PCP  Patient also wanted to know if Dr. Harl Bowie got a result of the labs she did May 11 for her PCP

## 2021-06-21 NOTE — Telephone Encounter (Signed)
Reports checking home BP's randomly BP this morning 135/85 taken with home [employer] BP meter Advised to continue monitoring BP, same time, same cuff, 1-2 hours after BP medications, 5-10 minutes after sitting. Advised to call in 2 weeks with readings. Verbalized understanding of plan.

## 2021-06-23 NOTE — Telephone Encounter (Signed)
Labs look fine. Agreew with monitoring bp longer to see trends and help decide if further changes are needed  Zandra Abts MD

## 2021-06-23 NOTE — Telephone Encounter (Signed)
Patient notified and verbalized understanding. 

## 2021-07-06 ENCOUNTER — Encounter: Payer: Self-pay | Admitting: Cardiology

## 2021-07-07 NOTE — Telephone Encounter (Signed)
BP's are up and down to some degree but overall ok, no changes  Zandra Abts MD

## 2021-08-09 ENCOUNTER — Ambulatory Visit
Admission: RE | Admit: 2021-08-09 | Discharge: 2021-08-09 | Disposition: A | Discharge status: Home / Self Care | Payer: Medicare HMO | Source: Ambulatory Visit | Attending: Internal Medicine | Admitting: Internal Medicine

## 2021-08-09 DIAGNOSIS — Z139 Encounter for screening, unspecified: Secondary | ICD-10-CM

## 2021-09-29 ENCOUNTER — Ambulatory Visit: Payer: Medicare HMO | Attending: Cardiology | Admitting: Cardiology

## 2021-09-29 ENCOUNTER — Encounter: Payer: Self-pay | Admitting: Cardiology

## 2021-09-29 VITALS — BP 136/80 | HR 58 | Ht 63.0 in | Wt 216.4 lb

## 2021-09-29 DIAGNOSIS — R0789 Other chest pain: Secondary | ICD-10-CM | POA: Diagnosis not present

## 2021-09-29 DIAGNOSIS — R600 Localized edema: Secondary | ICD-10-CM | POA: Diagnosis not present

## 2021-09-29 DIAGNOSIS — I10 Essential (primary) hypertension: Secondary | ICD-10-CM | POA: Diagnosis not present

## 2021-09-29 MED ORDER — LOSARTAN POTASSIUM 50 MG PO TABS
50.0000 mg | ORAL_TABLET | Freq: Every day | ORAL | 6 refills | Status: DC
Start: 2021-09-29 — End: 2022-09-10

## 2021-09-29 NOTE — Patient Instructions (Addendum)
Medication Instructions:  Decrease Losartan to '50mg'$  daily  Continue all other medications.     Labwork: none  Testing/Procedures: none  Follow-Up: 6 months   Any Other Special Instructions Will Be Listed Below (If Applicable).   If you need a refill on your cardiac medications before your next appointment, please call your pharmacy.

## 2021-09-29 NOTE — Progress Notes (Signed)
Clinical Summary Ms. [place] is a 71 y.o.female seen today for follow up of the following medical problems.      1. Chest pain - admission to [facility] 07/2015 with chest pain. Negative workup for ACS.  - 07/2015 echo [facility] Internal Med: LVEF 55-60%, mild LVH, abnormal diastolic function,   - 05/3612 lexiscan overall low risk, no significant ischemia.          -prior visit reported chest pain - under left breast. Somewhat sharp pain, 3/10 in severity. Can occur at rest or with exertion. No other assocaited symptoms. Not positional. Better with pressing on it. Lasts a few seconds. No relation to food or eating. Occurs 2-3 times per months - sedentary lifestyle due to back pain. DOE with walking [employer]  CAD risk factors: HTN, hyperlipidemia     12/2020 echo LVEF 60-65%, no WMAs - infrequent chest pains since last visit, overall ucnhanged from chronic prior symptoms.             2. LE edema - recent LE edema, bilateral. Long history of left leg swelling from prior surgeries but bialteral swelling is new - recent benign echo - taking lasix prn    - we tried stopping norvasc last visit due to LE edema.  - swelling much improved since last visit. Has not needed prn lasix.      3. HTN - lisionpril caused cough, changed to losartan - frequent urination, avoiding daily scheduled diuretic  - undergoing home bp monitoring by pcp - reports some low bp's 90s/60s - has lost weight, at least 21 lbs since 11/2020 - takes losartan '100mg'$ , metoprol, aldactone. In evening takes metoprolol.     4. PVCs - noted on previous EKGs.  - holter with 9% PVCs - no evidence of structural heart disease by echo or nuclear stress test.    - no recent palpitations.      5. Chronic back pain - followed by ortho   SH: son history of cerebral paulsy, passed away in his early 58s.   Has several dogs at home   Past Medical History:  Diagnosis Date   Abnormal mammogram of right  breast 08/30/2017   Anxiety    Arthritis    right knee- osteo   COPD (chronic obstructive pulmonary disease) ([facility])    mild    Depression    Dyslipidemia    GERD (gastroesophageal reflux disease)    Hypertension    Osteopenia    PONV (postoperative nausea and vomiting)      Allergies  Allergen Reactions   Codeine     REACTION: itch   Lisinopril Cough   Adhesive [Tape] Rash     Current Outpatient Medications  Medication Sig Dispense Refill   ALPRAZolam (XANAX) 0.5 MG tablet Take 0.5 mg by mouth in the morning, at noon, and at bedtime.      baclofen (LIORESAL) 10 MG tablet Take 10 mg by mouth as needed.     furosemide (LASIX) 20 MG tablet Take 1 tablet (20 mg total) by mouth as needed for edema (swelling). 30 tablet 2   gabapentin (NEURONTIN) 300 MG capsule Take 300 mg by mouth 3 (three) times daily as needed.     loratadine (CLARITIN) 10 MG tablet Take 10 mg by mouth daily.     losartan (COZAAR) 100 MG tablet Take 100 mg by mouth every morning.      meclizine (ANTIVERT) 25 MG tablet Take 25 mg by mouth 3 (three)  times daily as needed for dizziness.     metoprolol tartrate (LOPRESSOR) 25 MG tablet Take 25 mg by mouth 2 (two) times daily.     omeprazole (PRILOSEC) 20 MG capsule Take 20 mg by mouth daily.     ondansetron (ZOFRAN) 8 MG tablet Take 1 tablet by mouth as needed.     Oxymetazoline HCl (NASAL SPRAY NA) Place 1 spray into both nostrils as needed.     simvastatin (ZOCOR) 20 MG tablet Take 20 mg by mouth every evening.      spironolactone (ALDACTONE) 25 MG tablet Take 1 tablet (25 mg total) by mouth daily. 30 tablet 6   No current facility-administered medications for this visit.     Past Surgical History:  Procedure Laterality Date   BREAST BIOPSY Right 2019   sclerosing adenosis   BREAST EXCISIONAL BIOPSY Right 2019   BREAST LUMPECTOMY WITH RADIOACTIVE SEED LOCALIZATION Right 08/30/2017   Procedure: BREAST LUMPECTOMY WITH RADIOACTIVE SEED LOCALIZATION;  Surgeon:  Fanny Skates, MD;  Location: [facility];  Service: General;  Laterality: Right;   GALLBLADDER SURGERY  2000   left toe surgery x 3     hammer toe 01-14-08 & 01-06-10, remove screw 09-08-12, broke both sides of that ankle 08-14-14   TUBAL LIGATION  1984     Allergies  Allergen Reactions   Codeine     REACTION: itch   Lisinopril Cough   Adhesive [Tape] Rash      Family History  Problem Relation Age of Onset   COPD Mother      Social History Ms. [place] reports that she has never smoked. She has never used smokeless tobacco. Ms. [place] reports current alcohol use.   Review of Systems CONSTITUTIONAL: No weight loss, fever, chills, weakness or fatigue.  HEENT: Eyes: No visual loss, blurred vision, double vision or yellow sclerae.No hearing loss, sneezing, congestion, runny nose or sore throat.  SKIN: No rash or itching.  CARDIOVASCULAR: per hpi RESPIRATORY: No shortness of breath, cough or sputum.  GASTROINTESTINAL: No anorexia, nausea, vomiting or diarrhea. No abdominal pain or blood.  GENITOURINARY: No burning on urination, no polyuria NEUROLOGICAL: No headache, dizziness, syncope, paralysis, ataxia, numbness or tingling in the extremities. No change in bowel or bladder control.  MUSCULOSKELETAL: No muscle, back pain, joint pain or stiffness.  LYMPHATICS: No enlarged nodes. No history of splenectomy.  PSYCHIATRIC: No history of depression or anxiety.  ENDOCRINOLOGIC: No reports of sweating, cold or heat intolerance. No polyuria or polydipsia.  Marland Kitchen   Physical Examination Today's Vitals   09/29/21 1010  BP: 136/80  Pulse: (!) 58  SpO2: 100%  Weight: 216 lb 6.4 oz (98.2 kg)  Height: '5\' 3"'$  (1.6 m)   Body mass index is 38.33 kg/m.  Gen: resting comfortably, no acute distress HEENT: no scleral icterus, pupils equal round and reactive, no palptable cervical adenopathy,  CV: RRR, no mrg, no jvd Resp: Clear to auscultation bilaterally GI: abdomen is soft,  non-tender, non-distended, normal bowel sounds, no hepatosplenomegaly MSK: extremities are warm, no edema.  Skin: warm, no rash Neuro:  no focal deficits Psych: appropriate affect   Diagnostic Studies 08/2015 Holter Rhythm throughout study is sinus rhythm Min HR 46, max HR 92, Avg HR 62. The low heart rate of 46 occurred at 3AM presumably while patient sleeping There is rare supraventicular ectopy There is occasional ventricular ectopy (7393 beats, 9%) in the form of isolated PVCs and bigeminy. No nonsustained or sustained ventricuarl arrhythmias No diary submitted  08/2015 Nuclear stress ST segment depression was noted during stress in the II, III, aVF, V5 and V6 leads. Nonspecific TW change in V3-V4. The study is normal. Small mild basal septal defect likely related to adjacent gut radiotracer uptake, small area of ischemia less likely. Either findings supports low risk. The left ventricular ejection fraction is normal (55-65%). This is a low risk study.   04/2016 ABIs R 1.17 Left 1.16    12/2020 echo 1. Left ventricular ejection fraction, by estimation, is 60 to 65%. The  left ventricle has normal function. The left ventricle has no regional  wall motion abnormalities. Left ventricular diastolic parameters were  normal.   2. Right ventricular systolic function is normal. The right ventricular  size is normal.   3. The mitral valve is abnormal. Trivial mitral valve regurgitation. No  evidence of mitral stenosis.   4. The aortic valve was not well visualized. There is moderate  calcification of the aortic valve. There is moderate thickening of the  aortic valve. Aortic valve regurgitation is not visualized. Aortic valve  sclerosis/calcification is present, without  any evidence of aortic stenosis.   5. The inferior vena cava is normal in size with greater than 50%  respiratory variability, suggesting right atrial pressure of 3 mmHg.     Assessment and Plan   1. Chest  pain - chronic atypical chest pains overall stable, continue to montior   2. DOE/LE edema - recent echo was benign - edema much improved after stopping norvasc - continue to monitor   3. HTN - some low bp's in the AM at home after taking meds, lower losartan to '50mg'$  daily.  - if bp's become too high would try taking losartan '50mg'$  bid to see if spreading out dose avoids the drops with high dose in AM.     Arnoldo Lenis, M.D.

## 2021-12-18 ENCOUNTER — Other Ambulatory Visit: Payer: Self-pay | Admitting: Cardiology

## 2022-04-11 ENCOUNTER — Ambulatory Visit: Payer: Medicare HMO | Attending: Cardiology | Admitting: Cardiology

## 2022-04-11 VITALS — BP 140/90 | HR 56 | Ht 62.0 in | Wt 216.8 lb

## 2022-04-11 DIAGNOSIS — R600 Localized edema: Secondary | ICD-10-CM | POA: Diagnosis not present

## 2022-04-11 DIAGNOSIS — I10 Essential (primary) hypertension: Secondary | ICD-10-CM | POA: Diagnosis not present

## 2022-04-11 DIAGNOSIS — E782 Mixed hyperlipidemia: Secondary | ICD-10-CM

## 2022-04-11 DIAGNOSIS — R0789 Other chest pain: Secondary | ICD-10-CM

## 2022-04-11 NOTE — Patient Instructions (Addendum)

## 2022-04-11 NOTE — Progress Notes (Signed)
Clinical Summary Kristin Peters is a 72 y.o.female seen today for follow up of the following medical problems.      1. Chest pain - admission to [facility] 07/2015 with chest pain. Negative workup for ACS.  - 07/2015 echo [facility]: LVEF 55-60%, mild LVH, abnormal diastolic function,   - [id number] lexiscan overall low risk, no significant ischemia.   12/2020 echo LVEF 60-65%, no WMAs  - midchest/egigastric. Sharp like pain, 6-7/10. Usually occurs at night with laying down or at rest. No other assocaited symptoms. Lasts a few minutes. Not positional.          2. LE edema - recent LE edema, bilateral. Long history of left leg swelling from prior surgeries but bialteral swelling is new - recent benign echo - taking lasix prn   12/2020 echo LVEF 60-65%, no WMAs  -better off norvasc.  - has prn lasix, has not needed     3. HTN - lisionpril caused cough, changed to losartan - frequent urination, avoiding daily scheduled diuretic - norvasc caused LE edema   - undergoing home bp monitoring by pcp - reports some low bp's 90s/60s - has lost weight, at least 21 lbs since 11/2020 - takes losartan '100mg'$ , metoprol, aldactone. In evening takes metoprolol.   - checking home bp's, 110-140/80s   4. PVCs - noted on previous EKGs.  - holter with 9% PVCs - no evidence of structural heart disease by echo or nuclear stress test.    - no recent palpitaitons     5. Chronic back pain - followed by ortho  6. Hyperlipidemia - 05/2021 TC 148 TG 104 HDL 54 LDL 75   SH: son history of cerebral paulsy, passed away in his early 26s.   Has several dogs at home Past Medical History:  Diagnosis Date   Abnormal mammogram of right breast 08/30/2017   Anxiety    Arthritis    right knee- osteo   COPD (chronic obstructive pulmonary disease) (HCC)    mild    Depression    Dyslipidemia    GERD (gastroesophageal reflux disease)    Hypertension    Osteopenia    PONV (postoperative  nausea and vomiting)      Allergies  Allergen Reactions   Codeine     REACTION: itch   Lisinopril Cough   Norvasc [Amlodipine] Swelling    Leg swelling    Adhesive [Tape] Rash     Current Outpatient Medications  Medication Sig Dispense Refill   ALPRAZolam (XANAX) 0.5 MG tablet Take 0.5 mg by mouth in the morning, at noon, and at bedtime.      baclofen (LIORESAL) 10 MG tablet Take 10 mg by mouth as needed.     budesonide-formoterol (SYMBICORT) 160-4.5 MCG/ACT inhaler Inhale into the lungs.     clotrimazole-betamethasone (LOTRISONE) cream Apply topically.     cyanocobalamin (VITAMIN B12) 1000 MCG/ML injection Inject into the muscle.     diclofenac (VOLTAREN) 75 MG EC tablet Take by mouth.     furosemide (LASIX) 20 MG tablet Take 1 tablet (20 mg total) by mouth as needed for edema (swelling). 30 tablet 2   gabapentin (NEURONTIN) 300 MG capsule Take 300 mg by mouth 3 (three) times daily as needed.     HYDROcodone-acetaminophen (NORCO/VICODIN) 5-325 MG tablet 1 tablet every 4 (four) hours as needed for up to 30 doses.     L-Lysine HCl 500 MG TABS Take 1 tablet by mouth daily as needed.  Lactobacillus [facility]) CAPS Take 1 tablet by mouth daily.     loperamide (IMODIUM A-D) 2 MG tablet Take by mouth.     loratadine (CLARITIN) 10 MG tablet Take 10 mg by mouth daily.     losartan (COZAAR) 50 MG tablet Take 1 tablet (50 mg total) by mouth daily. 30 tablet 6   meclizine (ANTIVERT) 25 MG tablet Take 25 mg by mouth 3 (three) times daily as needed for dizziness.     metaxalone (SKELAXIN) 800 MG tablet Take by mouth.     metoprolol tartrate (LOPRESSOR) 25 MG tablet Take 25 mg by mouth 2 (two) times daily.     mupirocin ointment (BACTROBAN) 2 % Apply 1 Application topically 3 (three) times daily.     nystatin (MYCOSTATIN/NYSTOP) powder Apply topically.     omeprazole (PRILOSEC) 40 MG capsule Take 40 mg by mouth daily.     ondansetron (ZOFRAN) 8 MG tablet Take 1 tablet by mouth as  needed.     Oxymetazoline HCl (NASAL SPRAY NA) Place 1 spray into both nostrils as needed.     simvastatin (ZOCOR) 20 MG tablet Take 20 mg by mouth every evening.      spironolactone (ALDACTONE) 25 MG tablet TAKE 1 TABLET ONCE DAILY. 30 tablet 6   No current facility-administered medications for this visit.     Past Surgical History:  Procedure Laterality Date   BREAST BIOPSY Right 2019   sclerosing adenosis   BREAST EXCISIONAL BIOPSY Right 2019   BREAST LUMPECTOMY WITH RADIOACTIVE SEED LOCALIZATION Right 08/30/2017   Procedure: BREAST LUMPECTOMY WITH RADIOACTIVE SEED LOCALIZATION;  Surgeon: Fanny Skates, MD;  Location: [place];  Service: General;  Laterality: Right;   GALLBLADDER SURGERY  2000   left toe surgery x 3     hammer toe 01-14-08 & 01-06-10, remove screw 09-08-12, broke both sides of that ankle 08-14-14   TUBAL LIGATION  1984     Allergies  Allergen Reactions   Codeine     REACTION: itch   Lisinopril Cough   Norvasc [Amlodipine] Swelling    Leg swelling    Adhesive [Tape] Rash      Family History  Problem Relation Age of Onset   COPD Mother      Social History Kristin Peters reports that she has never smoked. She has never used smokeless tobacco. Kristin Peters reports current alcohol use.   Review of Systems CONSTITUTIONAL: No weight loss, fever, chills, weakness or fatigue.  HEENT: Eyes: No visual loss, blurred vision, double vision or yellow sclerae.No hearing loss, sneezing, congestion, runny nose or sore throat.  SKIN: No rash or itching.  CARDIOVASCULAR: per hpi RESPIRATORY: No shortness of breath, cough or sputum.  GASTROINTESTINAL: No anorexia, nausea, vomiting or diarrhea. No abdominal pain or blood.  GENITOURINARY: No burning on urination, no polyuria NEUROLOGICAL: No headache, dizziness, syncope, paralysis, ataxia, numbness or tingling in the extremities. No change in bowel or bladder control.  MUSCULOSKELETAL: No muscle, back pain,  joint pain or stiffness.  LYMPHATICS: No enlarged nodes. No history of splenectomy.  PSYCHIATRIC: No history of depression or anxiety.  ENDOCRINOLOGIC: No reports of sweating, cold or heat intolerance. No polyuria or polydipsia.  Marland Kitchen   Physical Examination Today's Vitals   04/11/22 0942 04/11/22 1006  BP: (!) 162/108 (!) 140/90  Pulse: (!) 56   SpO2: 96%   Weight: 216 lb 12.8 oz (98.3 kg)   Height: '5\' 2"'$  (1.575 m)    Body mass index is 39.65  kg/m.  Gen: resting comfortably, no acute distress HEENT: no scleral icterus, pupils equal round and reactive, no palptable cervical adenopathy,  CV: RRR, no m/rg no jvd Resp: Clear to auscultation bilaterally GI: abdomen is soft, non-tender, non-distended, normal bowel sounds, no hepatosplenomegaly MSK: extremities are warm, no edema.  Skin: warm, no rash Neuro:  no focal deficits Psych: appropriate affect   Diagnostic Studies  08/2015 Holter Rhythm throughout study is sinus rhythm Min HR 46, max HR 92, Avg HR 62. The low heart rate of 46 occurred at 3AM presumably while patient sleeping There is rare supraventicular ectopy There is occasional ventricular ectopy (7393 beats, 9%) in the form of isolated PVCs and bigeminy. No nonsustained or sustained ventricuarl arrhythmias No diary submitted     08/2015 Nuclear stress ST segment depression was noted during stress in the II, III, aVF, V5 and V6 leads. Nonspecific TW change in V3-V4. The study is normal. Small mild basal septal defect likely related to adjacent gut radiotracer uptake, small area of ischemia less likely. Either findings supports low risk. The left ventricular ejection fraction is normal (55-65%). This is a low risk study.   04/2016 ABIs R 1.17 Left 1.16    12/2020 echo 1. Left ventricular ejection fraction, by estimation, is 60 to 65%. The  left ventricle has normal function. The left ventricle has no regional  wall motion abnormalities. Left ventricular diastolic  parameters were  normal.   2. Right ventricular systolic function is normal. The right ventricular  size is normal.   3. The mitral valve is abnormal. Trivial mitral valve regurgitation. No  evidence of mitral stenosis.   4. The aortic valve was not well visualized. There is moderate  calcification of the aortic valve. There is moderate thickening of the  aortic valve. Aortic valve regurgitation is not visualized. Aortic valve  sclerosis/calcification is present, without  any evidence of aortic stenosis.   5. The inferior vena cava is normal in size with greater than 50%  respiratory variability, suggesting right atrial pressure of 3 mmHg.      Assessment and Plan   1. Chest pain - chronic atypical chest pains - recent symtpoms are atypical, no plans for repeat ischemic testing at this time.    2.. HTN - home bp's are at goal though elevated here in clinic, continue current med - prior low bp's in the AM on losartan, we lowered dose to '50mg'$ .   3.Hyperlipidemia -at goal, continue current meds  4. PVCs - no recent symptoms, continue metoprolol - EKG today shows SR at 56     Arnoldo Lenis, M.D.

## 2022-06-20 ENCOUNTER — Other Ambulatory Visit: Payer: Self-pay | Admitting: Cardiology

## 2022-06-23 IMAGING — MR MR LUMBAR SPINE W/O CM
4 of 5 series · 24 of 48 positions shown · non-contrast
Comparison: Report from lumbar spine radiographs 03/09/2011 (images
currently unavailable).

CLINICAL DATA: Lumbar radiculopathy. Lumbar pain. Additional
history provided by technologist: Patient reports centralized back
pain that radiates into right hip with left leg numbness at times,
weakness at times.

EXAM:
MRI LUMBAR SPINE WITHOUT CONTRAST
TECHNIQUE: Multiplanar, multisequence MR imaging of the lumbar spine was
performed. No intravenous contrast was administered.

[Series 3: T2 · sagittal · 4.0mm · 0.55mm/px · 6 of 15 slices shown (1 of 2)]
[im 1/15]
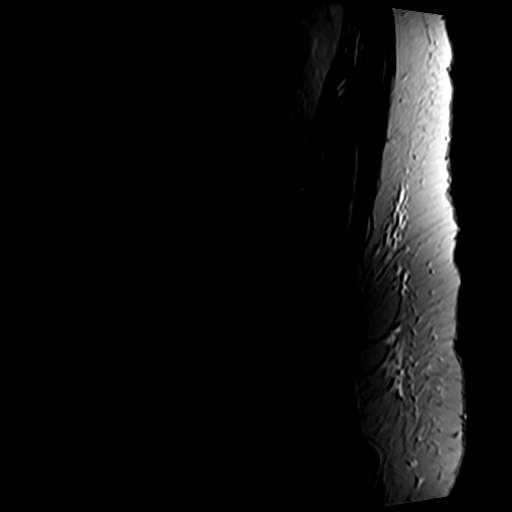
[im 3/15]
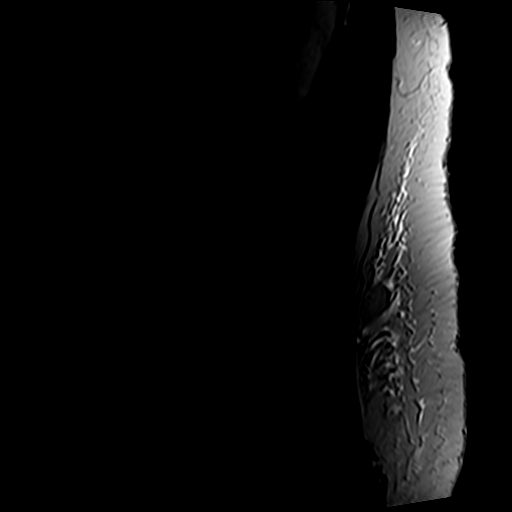
[im 6/15]
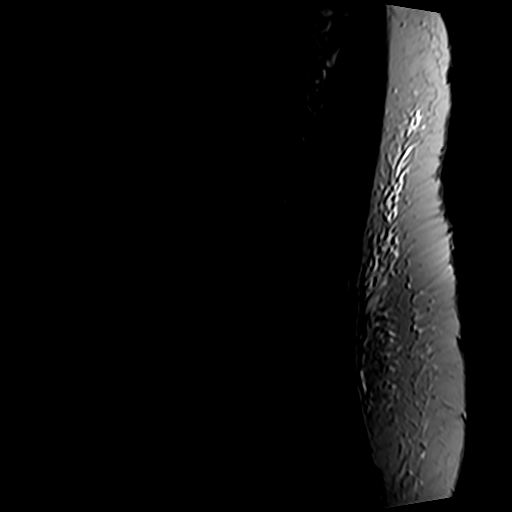
[im 9/15]
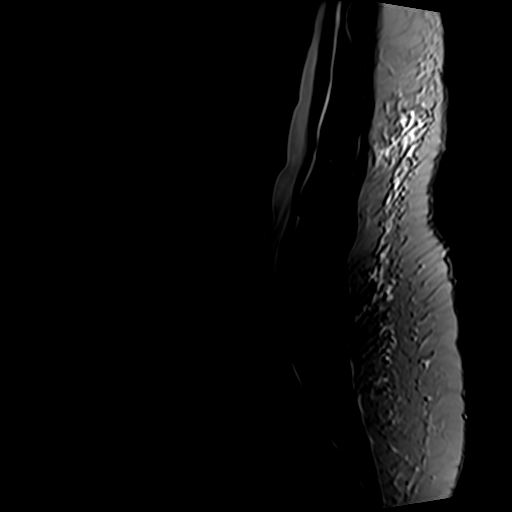
[im 12/15]
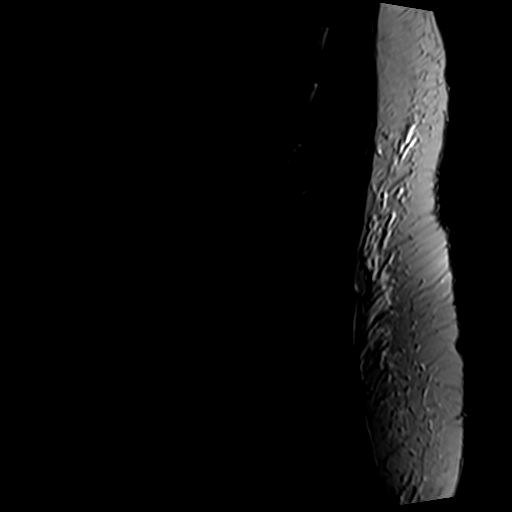
[im 15/15]
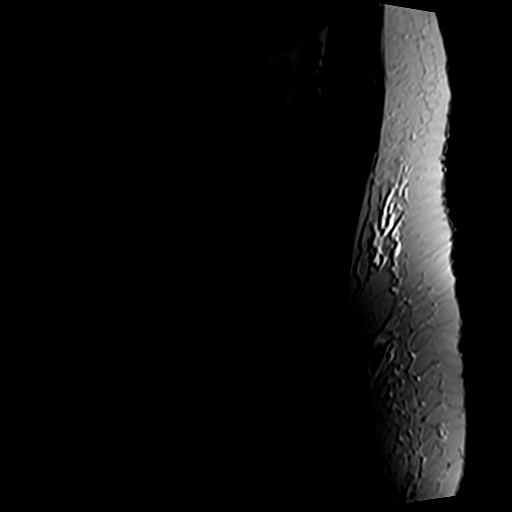

[Series 4: T1 · sagittal · 4.0mm · 0.55mm/px · 6 of 15 slices shown (1 of 2)]
[im 1/15]
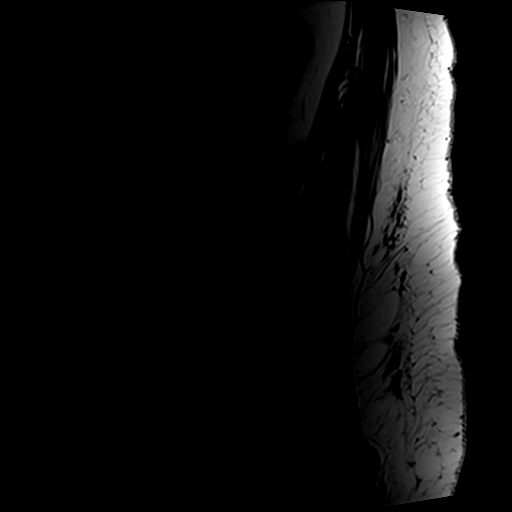
[im 3/15]
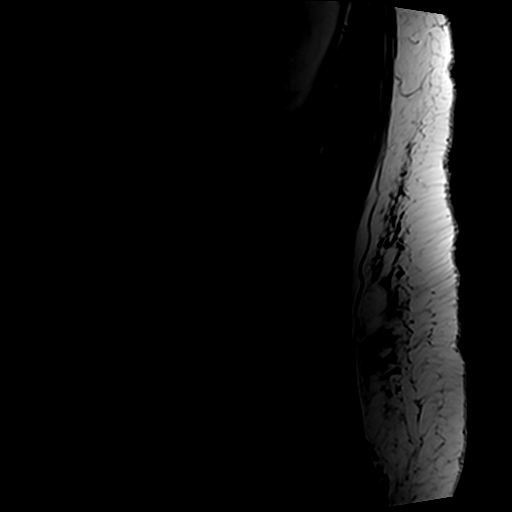
[im 6/15]
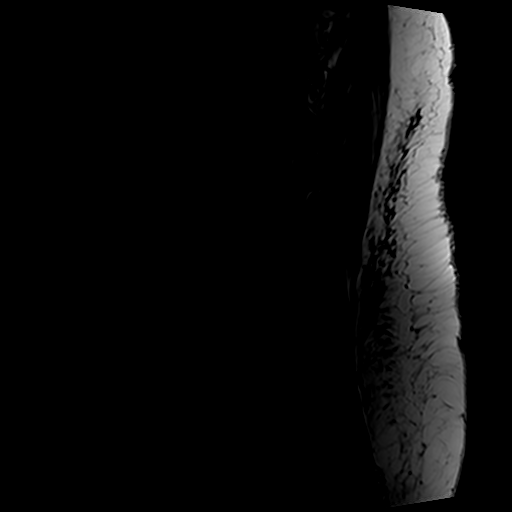
[im 9/15]
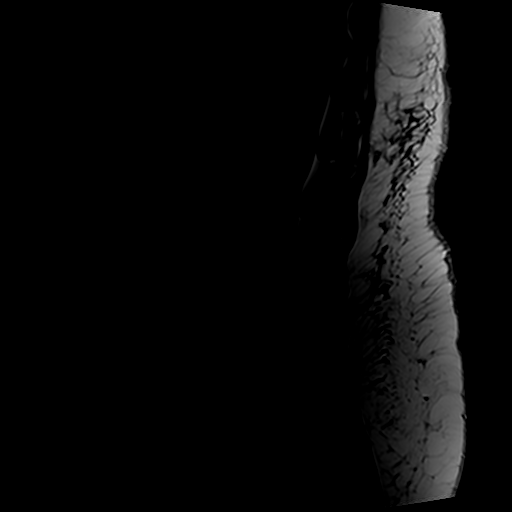
[im 12/15]
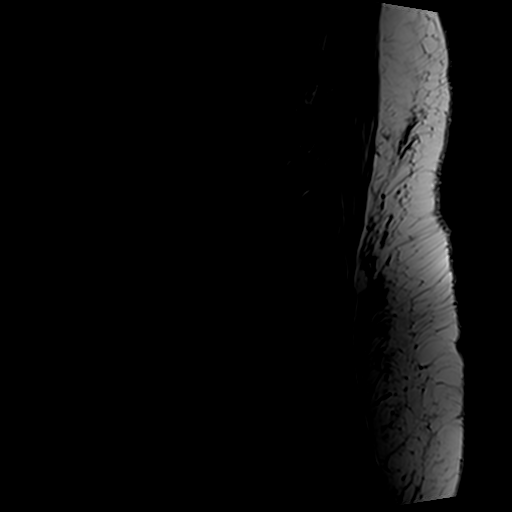
[im 15/15]
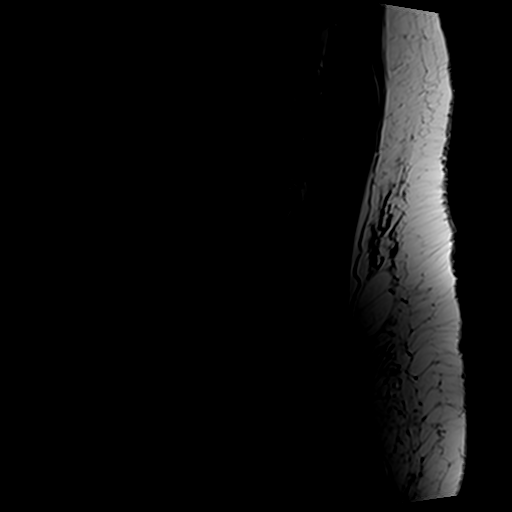

[Series 6: T2 · axial · 4.0mm · 0.70mm/px · z∈[-115,+96]mm · 9 of 39 slices shown (2 of 2)]
[im 1/39]
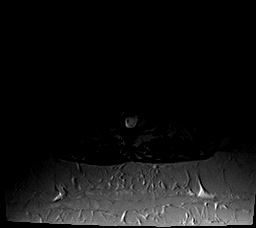
[im 6/39]
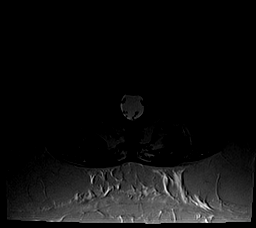
[im 11/39]
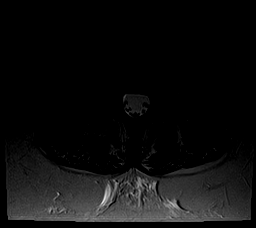
[im 17/39]
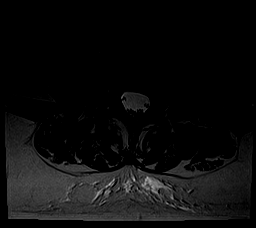
[im 20/39]
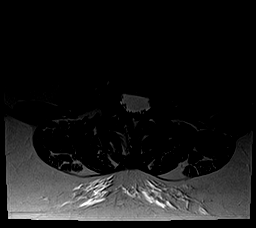
[im 22/39]
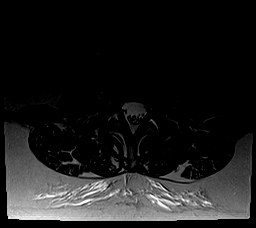
[im 28/39]
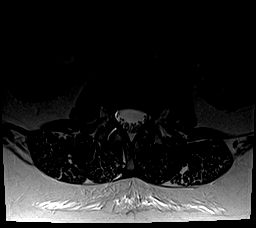
[im 33/39]
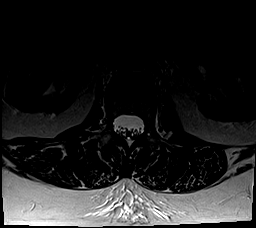
[im 39/39]
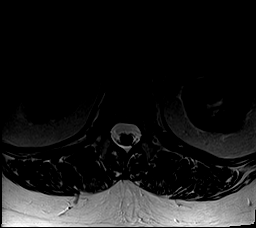

[Series 7: T1 · axial · 4.0mm · 0.35mm/px · z∈[-89,+67]mm · 3 of 39 slices shown (2 of 2)]
[im 6/39]
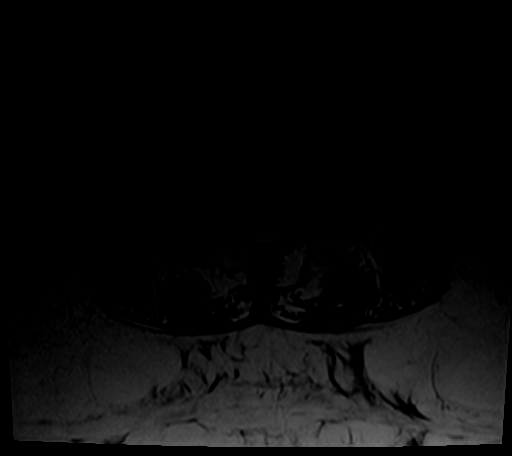
[im 20/39]
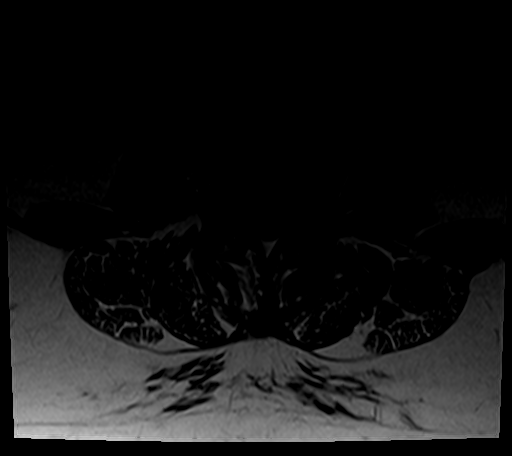
[im 33/39]
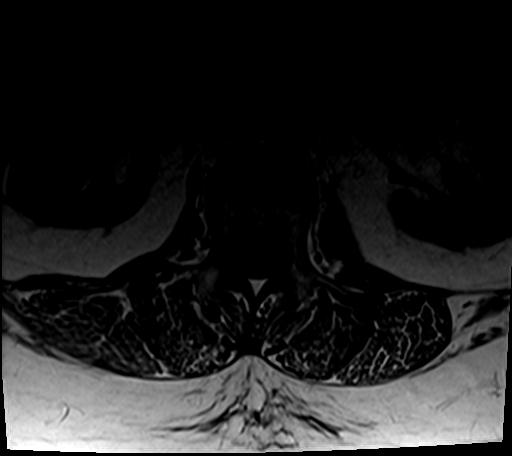

[24 of 48 positions shown; findings below may reference images not displayed]

FINDINGS: Segmentation: For the purposes of this dictation, five lumbar
vertebrae are assumed and the caudal most well-formed intervertebral
disc is designated L5-S1.

Alignment: Lumbar levocurvature. Mild grade 1 retrolisthesis at the
L1-L2, L2-L3, and L3-L4 levels. Mild L4-L5 grade 1 anterolisthesis.

Vertebrae: Vertebral body height is maintained. No suspicious
osseous lesion or significant marrow edema.

Conus medullaris and cauda equina: Conus extends to the L1-L2 level.
No signal abnormality within the visualized distal spinal cord.

Paraspinal and other soft tissues: No abnormality identified within
included portions of the abdomen/retroperitoneum. Atrophy of the
lumbar paraspinal musculature.

Disc levels:

Multilevel disc degeneration. Most notably, there is mild/moderate
L1-L2 disc degeneration, moderate L2-L3 disc degeneration and
mild/moderate L4-L5 disc degeneration.

T12-L1: Minimal disc bulge. No significant spinal canal or foraminal
stenosis.

L1-L2: Grade 1 retrolisthesis. Disc uncovering with minimal disc
bulge. No significant spinal canal or foraminal stenosis.

L2-L3: Grade 1 retrolisthesis. Left center posterior annular
fissure. Disc uncovering with disc bulge. Superimposed tiny
superimposed central disc protrusion (series 3, image 7). Mild facet
arthrosis. Small amount of fluid within the bilateral facet joints.
Minimal bilateral subarticular narrowing without nerve root
impingement. Central canal widely patent. Mild right neural
foraminal narrowing.

L3-L4: Trace retrolisthesis. Small left foraminal disc protrusion at
site of posterior annular fissure. Mild facet arthrosis. No
significant spinal canal stenosis. Mild left neural foraminal
narrowing with the disc protrusion abutting the exiting left L3
nerve root.

L4-L5: Mild grade 1 anterolisthesis. Tiny right subarticular disc
protrusion (series 6, image 30). Mild/moderate facet arthrosis with
mild ligamentum flavum hypertrophy. Small amount of fluid within the
right facet joint. Mild right subarticular narrowing with crowding
of the descending right L5 nerve root (series 6, image 30). Central
canal patent. No significant foraminal stenosis.

L5-S1: Disc bulge. Endplate spurring greatest along left lateral
aspect of the disc space. Mild facet arthrosis/ligamentum flavum
hypertrophy. No significant spinal canal stenosis mild left neural
foraminal narrowing.
IMPRESSION: Lumbar spondylosis as outlined with findings most notably as
follows.

At L3-L4, there is a small left foraminal disc protrusion at site of
posterior annular fissure. This contributes to mild left neural
foraminal narrowing with the disc protrusion abutting the exiting
left L3 nerve root.

At L2-L3, there is grade 1 retrolisthesis. Moderate disc
degeneration. Left center posterior annular fissure. Disc uncovering
with disc bulge. Superimposed tiny central disc protrusion. Mild
facet arthrosis. Small bilateral facet joint effusions. Minimal
bilateral subarticular narrowing without nerve root impingement.
Mild right neural foraminal narrowing.

At L4-L5, a tiny right subarticular disc protrusion contributes to
multifactorial right subarticular narrowing with crowding of the
descending right L5 nerve root.

Multifactorial mild L5-S1 left neural foraminal narrowing.

Multilevel disc degeneration greatest at L1-L2, L2-L3 and L4-L5.

## 2022-09-10 ENCOUNTER — Other Ambulatory Visit: Payer: Self-pay | Admitting: Cardiology

## 2022-09-17 ENCOUNTER — Other Ambulatory Visit: Payer: Self-pay | Admitting: Cardiology

## 2023-02-18 ENCOUNTER — Other Ambulatory Visit: Payer: Self-pay | Admitting: Cardiology

## 2023-06-03 ENCOUNTER — Encounter: Payer: Self-pay | Admitting: Cardiology

## 2023-06-03 ENCOUNTER — Ambulatory Visit: Attending: Cardiology | Admitting: Cardiology

## 2023-06-03 VITALS — BP 142/78 | HR 60 | Ht 62.0 in | Wt 233.4 lb

## 2023-06-03 DIAGNOSIS — E782 Mixed hyperlipidemia: Secondary | ICD-10-CM

## 2023-06-03 DIAGNOSIS — I10 Essential (primary) hypertension: Secondary | ICD-10-CM

## 2023-06-03 DIAGNOSIS — R0789 Other chest pain: Secondary | ICD-10-CM

## 2023-06-03 DIAGNOSIS — I493 Ventricular premature depolarization: Secondary | ICD-10-CM | POA: Diagnosis not present

## 2023-06-03 NOTE — Progress Notes (Signed)
 Clinical Summary Ms. Belarus is a 73 y.o.female seen today for follow up of the following medical problems.      1. Chest pain - admission to [facility] 07/2015 with chest pain. Negative workup for ACS.  - 07/2015 echo [facility] Internal Med: LVEF 55-60%, mild LVH, abnormal diastolic function,   - 08/2015 lexiscan  overall low risk, no significant ischemia.   12/2020 echo LVEF 60-65%, no WMAs   - no recent significant symptoms.      2. LE edema - recent LE edema, bilateral. Long history of left leg swelling from prior surgeries but bialteral swelling is new 12/2020 echo LVEF 60-65%, no WMAs - no recent edema. Seems to have been related primarily to norvasc     3. HTN - lisionpril caused cough, changed to losartan  - frequent urination, avoiding daily scheduled diuretic - norvasc caused LE edema     -compliantw with meds - has gained 17 lbs since last year.    4. PVCs - noted on previous EKGs.  - holter with 9% PVCs - no evidence of structural heart disease by echo or nuclear stress test.    - denies palpitaitons     5. Chronic back pain - followed by ortho   6. Hyperlipidemia - 05/2021 TC 148 TG 104 HDL 54 LDL 75 - labs followed by pcp   SH: son history of cerebral paulsy, passed away in his early 42s.  Husband has advanced kidney disease, borderline needing dialysis.   Has several dogs at home Past Medical History:  Diagnosis Date   Abnormal mammogram of right breast 08/30/2017   Anxiety    Arthritis    right knee- osteo   COPD (chronic obstructive pulmonary disease) (HCC)    mild    Depression    Dyslipidemia    GERD (gastroesophageal reflux disease)    Hypertension    Osteopenia    PONV (postoperative nausea and vomiting)      Allergies  Allergen Reactions   Codeine     REACTION: itch   Lisinopril Cough   Norvasc [Amlodipine] Swelling    Leg swelling    Adhesive [Tape] Rash     Current Outpatient Medications  Medication Sig Dispense  Refill   ALPRAZolam (XANAX) 0.5 MG tablet Take 0.5 mg by mouth in the morning, at noon, and at bedtime.      baclofen (LIORESAL) 10 MG tablet Take 10 mg by mouth as needed.     gabapentin  (NEURONTIN ) 300 MG capsule Take 300 mg by mouth 3 (three) times daily as needed.     L-Lysine HCl 500 MG TABS Take 1 tablet by mouth daily as needed.     loperamide (IMODIUM A-D) 2 MG tablet Take by mouth.     loratadine (CLARITIN) 10 MG tablet Take 10 mg by mouth daily.     losartan  (COZAAR ) 50 MG tablet take 1 tablet once daily. 90 tablet 3   meclizine (ANTIVERT) 25 MG tablet Take 25 mg by mouth 3 (three) times daily as needed for dizziness.     metoprolol  tartrate (LOPRESSOR ) 25 MG tablet Take 25 mg by mouth 2 (two) times daily.     nystatin (MYCOSTATIN/NYSTOP) powder Apply topically.     omeprazole (PRILOSEC) 40 MG capsule Take 40 mg by mouth daily.     ondansetron  (ZOFRAN ) 8 MG tablet Take 1 tablet by mouth as needed.     Oxymetazoline HCl (NASAL SPRAY NA) Place 1 spray into both nostrils as needed.  simvastatin (ZOCOR) 20 MG tablet Take 20 mg by mouth every evening.      spironolactone  (ALDACTONE ) 25 MG tablet take 1 tablet once daily. 90 tablet 2   budesonide-formoterol (SYMBICORT) 160-4.5 MCG/ACT inhaler Inhale into the lungs.     clotrimazole-betamethasone (LOTRISONE) cream Apply topically.     cyanocobalamin (VITAMIN B12) 1000 MCG/ML injection Inject into the muscle.     diclofenac (VOLTAREN) 75 MG EC tablet Take by mouth.     furosemide  (LASIX ) 20 MG tablet Take 1 tablet (20 mg total) by mouth as needed for edema (swelling). 30 tablet 2   HYDROcodone -acetaminophen  (NORCO/VICODIN) 5-325 MG tablet 1 tablet every 4 (four) hours as needed for up to 30 doses.     Lactobacillus [facility]OM[facility]) CAPS Take 1 tablet by mouth daily.     metaxalone (SKELAXIN) 800 MG tablet Take by mouth.     mupirocin ointment (BACTROBAN) 2 % Apply 1 Application topically 3 (three) times daily.     No current  facility-administered medications for this visit.     Past Surgical History:  Procedure Laterality Date   BREAST BIOPSY Right 2019   sclerosing adenosis   BREAST EXCISIONAL BIOPSY Right 2019   BREAST LUMPECTOMY WITH RADIOACTIVE SEED LOCALIZATION Right 08/30/2017   Procedure: BREAST LUMPECTOMY WITH RADIOACTIVE SEED LOCALIZATION;  Surgeon: Boyce Byes, MD;  Location: [facility];  Service: General;  Laterality: Right;   GALLBLADDER SURGERY  2000   left toe surgery x 3     hammer toe 01-14-08 & 01-06-10, remove screw 09-08-12, broke both sides of that ankle 08-14-14   TUBAL LIGATION  1984     Allergies  Allergen Reactions   Codeine     REACTION: itch   Lisinopril Cough   Norvasc [Amlodipine] Swelling    Leg swelling    Adhesive [Tape] Rash      Family History  Problem Relation Age of Onset   COPD Mother      Social History Ms. Belarus reports that she has never smoked. She has never used smokeless tobacco. Ms. Belarus reports current alcohol  use.    Physical Examination Today's Vitals   06/03/23 1341 06/03/23 1355 06/03/23 1431  BP: (!) 140/87 (!) 160/82 (!) 142/78  Pulse: 60    SpO2: 97%    Weight: 233 lb 6.4 oz (105.9 kg)    Height: 5\' 2"  (1.575 m)     Body mass index is 42.69 kg/m.  Gen: resting comfortably, no acute distress HEENT: no scleral icterus, pupils equal round and reactive, no palptable cervical adenopathy,  CV: RRR, no mrg, no jvd Resp: Clear to auscultation bilaterally GI: abdomen is soft, non-tender, non-distended, normal bowel sounds, no hepatosplenomegaly MSK: extremities are warm, no edema.  Skin: warm, no rash Neuro:  no focal deficits Psych: appropriate affect   Diagnostic Studies   08/2015 Holter Rhythm throughout study is sinus rhythm Min HR 46, max HR 92, Avg HR 62. The low heart rate of 46 occurred at 3AM presumably while patient sleeping There is rare supraventicular ectopy There is occasional ventricular ectopy  (7393 beats, 9%) in the form of isolated PVCs and bigeminy. No nonsustained or sustained ventricuarl arrhythmias No diary submitted     08/2015 Nuclear stress ST segment depression was noted during stress in the II, III, aVF, V5 and V6 leads. Nonspecific TW change in V3-V4. The study is normal. Small mild basal septal defect likely related to adjacent gut radiotracer uptake, small area of ischemia less likely. Either findings  supports low risk. The left ventricular ejection fraction is normal (55-65%). This is a low risk study.   04/2016 ABIs R 1.17 Left 1.16    12/2020 echo 1. Left ventricular ejection fraction, by estimation, is 60 to 65%. The  left ventricle has normal function. The left ventricle has no regional  wall motion abnormalities. Left ventricular diastolic parameters were  normal.   2. Right ventricular systolic function is normal. The right ventricular  size is normal.   3. The mitral valve is abnormal. Trivial mitral valve regurgitation. No  evidence of mitral stenosis.   4. The aortic valve was not well visualized. There is moderate  calcification of the aortic valve. There is moderate thickening of the  aortic valve. Aortic valve regurgitation is not visualized. Aortic valve  sclerosis/calcification is present, without  any evidence of aortic stenosis.   5. The inferior vena cava is normal in size with greater than 50%  respiratory variability, suggesting right atrial pressure of 3 mmHg.       Assessment and Plan   1. Chest pain - chronic history of atypical chest pains - no recent significant new symptoms, continue to monitor at this time EKG shows SR, no ischemic changes   2.. HTN - bp elevated here, she will call us  in 1 week to update on home bp's - has gained about 17 lbs since last year so may have truly increased. Would increase losartan  if needed.    3.Hyperlipidemia -request pcp labs   4. PVCs - no symptoms, continue metoprolol    F/u 1  year     Laurann Pollock, M.D.

## 2023-06-03 NOTE — Patient Instructions (Addendum)
 Medication Instructions:  Your physician recommends that you continue on your current medications as directed. Please refer to the Current Medication list given to you today.   Labwork: Requested labs from Primary Care Physician   Testing/Procedures: None  Follow-Up: Your physician recommends that you schedule a follow-up appointment in: 1 year. You will receive a reminder call in about 8 months reminding you to schedule your appointment. If you don't receive this call, please contact our office.   Any Other Special Instructions Will Be Listed Below (If Applicable). Call us  in a week with blood pressure readings or send through MyChart.  Thank you for choosing [facility]!     If you need a refill on your cardiac medications before your next appointment, please call your pharmacy.

## 2023-06-05 ENCOUNTER — Ambulatory Visit: Admitting: Cardiology

## 2023-06-08 ENCOUNTER — Encounter: Payer: Self-pay | Admitting: Cardiology

## 2023-07-09 NOTE — Telephone Encounter (Signed)
 Sorry for late response, home bp's look good  Letta Raw MD

## 2023-09-12 ENCOUNTER — Other Ambulatory Visit: Payer: Self-pay | Admitting: Cardiology

## 2023-11-12 ENCOUNTER — Other Ambulatory Visit: Payer: Self-pay | Admitting: Cardiology

## 2023-12-12 ENCOUNTER — Other Ambulatory Visit: Payer: Self-pay | Admitting: Cardiology
